# Patient Record
Sex: Female | Born: 1975 | Race: White | Marital: Married | Smoking: Never smoker
Health system: Northeastern US, Academic
[De-identification: ages and names within clinical notes are randomized; demographics above are authoritative.]

---

## 2009-01-13 HISTORY — PX: FINGER SURGERY: SHX640

## 2014-01-05 ENCOUNTER — Encounter (HOSPITAL_COMMUNITY): Payer: Self-pay | Admitting: Emergency Medicine

## 2014-01-05 ENCOUNTER — Emergency Department (HOSPITAL_COMMUNITY)
Admission: EM | Admit: 2014-01-05 | Discharge: 2014-01-05 | Disposition: A | Discharge status: Home / Self Care | Payer: BC Managed Care – PPO | Attending: Emergency Medicine | Admitting: Emergency Medicine

## 2014-01-05 ENCOUNTER — Emergency Department (HOSPITAL_COMMUNITY): Payer: BC Managed Care – PPO

## 2014-01-05 DIAGNOSIS — Z3202 Encounter for pregnancy test, result negative: Secondary | ICD-10-CM | POA: Diagnosis not present

## 2014-01-05 DIAGNOSIS — N83201 Unspecified ovarian cyst, right side: Secondary | ICD-10-CM

## 2014-01-05 DIAGNOSIS — N8320 Unspecified ovarian cysts: Secondary | ICD-10-CM | POA: Insufficient documentation

## 2014-01-05 DIAGNOSIS — R319 Hematuria, unspecified: Secondary | ICD-10-CM

## 2014-01-05 DIAGNOSIS — N83202 Unspecified ovarian cyst, left side: Secondary | ICD-10-CM

## 2014-01-05 DIAGNOSIS — R1031 Right lower quadrant pain: Secondary | ICD-10-CM | POA: Diagnosis present

## 2014-01-05 LAB — URINE MICROSCOPIC-ADD ON

## 2014-01-05 LAB — CBC WITH DIFFERENTIAL/PLATELET
BASOS ABS: 0 10*3/uL (ref 0.0–0.1)
BASOS PCT: 0 % (ref 0–1)
Eosinophils Absolute: 0 10*3/uL (ref 0.0–0.7)
Eosinophils Relative: 0 % (ref 0–5)
HCT: 37.8 % (ref 36.0–46.0)
Hemoglobin: 12.8 g/dL (ref 12.0–15.0)
LYMPHS PCT: 21 % (ref 12–46)
Lymphs Abs: 1.5 10*3/uL (ref 0.7–4.0)
MCH: 31.7 pg (ref 26.0–34.0)
MCHC: 33.9 g/dL (ref 30.0–36.0)
MCV: 93.6 fL (ref 78.0–100.0)
Monocytes Absolute: 0.6 10*3/uL (ref 0.1–1.0)
Monocytes Relative: 8 % (ref 3–12)
NEUTROS ABS: 5.2 10*3/uL (ref 1.7–7.7)
Neutrophils Relative %: 71 % (ref 43–77)
PLATELETS: 177 10*3/uL (ref 150–400)
RBC: 4.04 MIL/uL (ref 3.87–5.11)
RDW: 12.7 % (ref 11.5–15.5)
WBC: 7.3 10*3/uL (ref 4.0–10.5)

## 2014-01-05 LAB — COMPREHENSIVE METABOLIC PANEL
ALT: 12 U/L (ref 0–35)
ANION GAP: 7 (ref 5–15)
AST: 15 U/L (ref 0–37)
Albumin: 3.5 g/dL (ref 3.5–5.2)
Alkaline Phosphatase: 46 U/L (ref 39–117)
BILIRUBIN TOTAL: 0.7 mg/dL (ref 0.3–1.2)
BUN: 6 mg/dL (ref 6–23)
CHLORIDE: 110 meq/L (ref 96–112)
CO2: 25 mmol/L (ref 19–32)
CREATININE: 0.62 mg/dL (ref 0.50–1.10)
Calcium: 8.7 mg/dL (ref 8.4–10.5)
GFR calc Af Amer: >90 mL/min (ref >90)
Glucose, Bld: 107 mg/dL — ABNORMAL HIGH (ref 70–99)
Potassium: 3.7 mmol/L (ref 3.5–5.1)
Sodium: 142 mmol/L (ref 135–145)
Total Protein: 6.5 g/dL (ref 6.0–8.3)

## 2014-01-05 LAB — URINALYSIS, ROUTINE W REFLEX MICROSCOPIC
Bilirubin Urine: NEGATIVE
Glucose, UA: NEGATIVE mg/dL
Ketones, ur: NEGATIVE mg/dL
LEUKOCYTES UA: NEGATIVE
Nitrite: NEGATIVE
PH: 5.5 (ref 5.0–8.0)
Protein, ur: NEGATIVE mg/dL
SPECIFIC GRAVITY, URINE: 1.013 (ref 1.005–1.030)
UROBILINOGEN UA: 0.2 mg/dL (ref 0.0–1.0)

## 2014-01-05 LAB — PREGNANCY, URINE: Preg Test, Ur: NEGATIVE

## 2014-01-05 MED ORDER — IOHEXOL 300 MG/ML  SOLN
80.0000 mL | Freq: Once | INTRAMUSCULAR | Status: AC | PRN
  Administered 2014-01-05: 80 mL via INTRAVENOUS

## 2014-01-05 MED ORDER — HYDROCODONE-ACETAMINOPHEN 5-325 MG PO TABS: 1.0000 | ORAL_TABLET | ORAL | Status: AC | PRN

## 2014-01-05 MED ORDER — IBUPROFEN 800 MG PO TABS: 800.0000 mg | ORAL_TABLET | Freq: Three times a day (TID) | ORAL | Status: AC

## 2014-01-05 NOTE — ED Notes (Signed)
Pts family member given cup of coffee.

## 2014-01-05 NOTE — ED Provider Notes (Signed)
CSN: [id number]     Arrival date & time 01/05/14  0822 History   First MD Initiated Contact with Patient 01/05/14 0831     Chief Complaint  Patient presents with  . Abdominal Pain     (Consider location/radiation/quality/duration/timing/severity/associated sxs/prior Treatment) HPI Comments: Pt comes in today with complaint of abdominal pain that started acutely yesterday morning. She has taken some ibuprofen with mild relief. Pt states that the pain is constant. No fever, vomiting. Has had some diarrhea. States that she has a miscarriage in august and has some general pelvic pain which tends to be more on the left side. Pt states that this was diffuse both more localized to the right side.known fibroid. lmp last week:denies vaginal discharge  The history is provided by the patient. No language interpreter was used.    History reviewed. No pertinent past medical history. History reviewed. No pertinent past surgical history. No family history on file. History  Substance Use Topics  . Smoking status: Never Smoker   . Smokeless tobacco: Not on file  . Alcohol Use: Yes   OB History    Gravida Para Term Preterm AB TAB SAB Ectopic Multiple Living   1    1          Review of Systems  All other systems reviewed and are negative.     Allergies  Sulfa antibiotics  Home Medications   Prior to Admission medications   Medication Sig Start Date End Date Taking? Authorizing Provider  ibuprofen (ADVIL,MOTRIN) 200 MG tablet Take 800 mg by mouth every 6 (six) hours as needed (pain).   Yes Historical Provider, MD   BP 125/76 mmHg  Pulse 85  Temp(Src) 98 F (36.7 C) (Oral)  Resp 16  SpO2 100%  LMP 01/04/2014 Physical Exam  Constitutional: She is oriented to person, place, and time. She appears well-developed and well-nourished.  Cardiovascular: Normal rate and regular rhythm.   Pulmonary/Chest: Effort normal and breath sounds normal.  Abdominal: Soft. Bowel sounds are normal. There  is tenderness in the right lower quadrant.  Musculoskeletal: Normal range of motion.  Neurological: She is alert and oriented to person, place, and time.  Skin: Skin is warm and dry.  Psychiatric: She has a normal mood and affect.  Nursing note and vitals reviewed.   ED Course  Procedures (including critical care time) Labs Review Labs Reviewed  COMPREHENSIVE METABOLIC PANEL - Abnormal; Notable for the following:    Glucose, Bld 107 (*)    All other components within normal limits  URINALYSIS, ROUTINE W REFLEX MICROSCOPIC - Abnormal; Notable for the following:    APPearance CLOUDY (*)    Hgb urine dipstick MODERATE (*)    All other components within normal limits  URINE MICROSCOPIC-ADD ON - Abnormal; Notable for the following:    Squamous Epithelial / LPF FEW (*)    All other components within normal limits  CBC WITH DIFFERENTIAL  PREGNANCY, URINE    Imaging Review Ct Abdomen Pelvis W Contrast  01/05/2014   CLINICAL DATA:  Abdominal pain, possible appendicitis,, right lower quadrant pain for few days, diffuse abdominal pain last night  EXAM: CT ABDOMEN AND PELVIS WITH CONTRAST  TECHNIQUE: Multidetector CT imaging of the abdomen and pelvis was performed using the standard protocol following bolus administration of intravenous contrast.  CONTRAST:  [place]40Equ4[place]mti[place]407Equ3[place]ti[place]402LuzEqu3[place]mti[place]406Luz(9Equ6[place]mti[place]408Luz3CEquities tradery IOHEXOL 300 MG/ML  SOLN  COMPARISON:  None.  FINDINGS: Sagittal images of the spine shows mild disc space flattening with vacuum disc phenomenon at L5-S1 level.  Minimal posterior spurring at L5-S1 level.  Lung bases are unremarkable.  Enhanced liver shows no focal mass. No intrahepatic biliary ductal dilatation. No calcified gallstones are noted within gallbladder. Enhanced pancreas, spleen and adrenal glands are unremarkable. Kidneys are symmetrical in size and enhancement. No focal renal mass. No hydronephrosis or hydroureter. Abdominal aorta is unremarkable.  No small bowel obstruction. No thickened or dilated small bowel loops  are noted.  No destructive bony lesions are noted within pelvis. Bilateral hip joints are unremarkable. No inguinal adenopathy.  There is no pericecal inflammation. Normal appendix clearly visualized in axial image 56.  Normal appendix confirmed in coronal image 29.  The uterus measures 8 by 5.3 cm. Question mild septate/ arcuate uterus fundus. There is a myometrial fibroid in left uterine body measures 4.8 x 3.4 cm. Best seen in sagittal image 63. Mild mass effect on the left endometrial cavity. A simple cyst/follicle within right ovary measures 2.6 cm. There is probable minimal hemorrhagic cyst within left ovary anteriorly measures 3.1 cm. Second left ovarian cyst measures 3.2 cm. Follow-up pelvic ultrasound in 6-8 weeks is suggested as clinically warranted. No pelvic free fluid. The urinary bladder is unremarkable.  No inguinal adenopathy.  IMPRESSION: 1. No acute inflammatory process within abdomen. 2. Normal appendix.  No pericecal inflammation. 3. There is a myometrial fibroid within left uterine body measures 4.8 x 3.4 cm. A right ovarian cyst/follicle measures 2.6 cm. Minimal hemorrhagic cyst within left ovary anteriorly measures 3.1 cm. Second simple cyst within left ovary measures 3.2 cm. Follow-up pelvic ultrasound in 6-8 weeks is suggested as clinically warranted. Question arcuate uterus. 4. No hydronephrosis or hydroureter. 5. No small bowel obstruction.   Electronically Signed   By: Natasha MeadLiviu  Pop M.D.   On: 01/05/2014 11:38     EKG Interpretation None      MDM   Final diagnoses:  Cysts of both ovaries  Hematuria    Discussed findings with pt and she will follow up with gyn when she gets home. Will send home with ibuprofen and hydrocodone for pain.    Teressa LowerVrinda Daud Cayer, NP 01/05/14 1155  Purvis SheffieldForrest Harrison, MD 01/05/14 2038

## 2014-01-05 NOTE — ED Notes (Signed)
Patient states she had a miscarriage in August and has been having some abdominal pain since.   Patient states has worsened in the last few days and mostly in RLQ.   Patient states diffuse pain with 8/10 pain last night.  Patient states she took ibuprofen for pain.   Patient denies urinary symptoms.   Patient states has had some nausea, but denies vomiting.

## 2014-01-05 NOTE — ED Notes (Signed)
Patient transported to CT 

## 2014-01-05 NOTE — Discharge Instructions (Signed)
Ovarian Cyst An ovarian cyst is a fluid-filled sac that forms on an ovary. The ovaries are small organs that produce eggs in women. Various types of cysts can form on the ovaries. Most are not cancerous. Many do not cause problems, and they often go away on their own. Some may cause symptoms and require treatment. Common types of ovarian cysts include:  Functional cysts--These cysts may occur every month during the menstrual cycle. This is normal. The cysts usually go away with the next menstrual cycle if the woman does not get pregnant. Usually, there are no symptoms with a functional cyst.  Endometrioma cysts--These cysts form from the tissue that lines the uterus. They are also called "chocolate cysts" because they become filled with blood that turns brown. This type of cyst can cause pain in the lower abdomen during intercourse and with your menstrual period.  Cystadenoma cysts--This type develops from the cells on the outside of the ovary. These cysts can get very big and cause lower abdomen pain and pain with intercourse. This type of cyst can twist on itself, cut off its blood supply, and cause severe pain. It can also easily rupture and cause a lot of pain.  Dermoid cysts--This type of cyst is sometimes found in both ovaries. These cysts may contain different kinds of body tissue, such as skin, teeth, hair, or cartilage. They usually do not cause symptoms unless they get very big.  Theca lutein cysts--These cysts occur when too much of a certain hormone (human chorionic gonadotropin) is produced and overstimulates the ovaries to produce an egg. This is most common after procedures used to assist with the conception of a baby (in vitro fertilization). CAUSES   Fertility drugs can cause a condition in which multiple large cysts are formed on the ovaries. This is called ovarian hyperstimulation syndrome.  A condition called polycystic ovary syndrome can cause hormonal imbalances that can lead to  nonfunctional ovarian cysts. SIGNS AND SYMPTOMS  Many ovarian cysts do not cause symptoms. If symptoms are present, they may include:  Pelvic pain or pressure.  Pain in the lower abdomen.  Pain during sexual intercourse.  Increasing girth (swelling) of the abdomen.  Abnormal menstrual periods.  Increasing pain with menstrual periods.  Stopping having menstrual periods without being pregnant. DIAGNOSIS  These cysts are commonly found during a routine or annual pelvic exam. Tests may be ordered to find out more about the cyst. These tests may include:  Ultrasound.  X-ray of the pelvis.  CT scan.  MRI.  Blood tests. TREATMENT  Many ovarian cysts go away on their own without treatment. Your health care provider may want to check your cyst regularly for 2-3 months to see if it changes. For women in menopause, it is particularly important to monitor a cyst closely because of the higher rate of ovarian cancer in menopausal women. When treatment is needed, it may include any of the following:  A procedure to drain the cyst (aspiration). This may be done using a long needle and ultrasound. It can also be done through a laparoscopic procedure. This involves using a thin, lighted tube with a tiny camera on the end (laparoscope) inserted through a small incision.  Surgery to remove the whole cyst. This may be done using laparoscopic surgery or an open surgery involving a larger incision in the lower abdomen.  Hormone treatment or birth control pills. These methods are sometimes used to help dissolve a cyst. HOME CARE INSTRUCTIONS   Only take over-the-counter   or prescription medicines as directed by your health care provider.  Follow up with your health care provider as directed.  Get regular pelvic exams and Pap tests. SEEK MEDICAL CARE IF:   Your periods are late, irregular, or painful, or they stop.  Your pelvic pain or abdominal pain does not go away.  Your abdomen becomes  larger or swollen.  You have pressure on your bladder or trouble emptying your bladder completely.  You have pain during sexual intercourse.  You have feelings of fullness, pressure, or discomfort in your stomach.  You lose weight for no apparent reason.  You feel generally ill.  You become constipated.  You lose your appetite.  You develop acne.  You have an increase in body and facial hair.  You are gaining weight, without changing your exercise and eating habits.  You think you are pregnant. SEEK IMMEDIATE MEDICAL CARE IF:   You have increasing abdominal pain.  You feel sick to your stomach (nauseous), and you throw up (vomit).  You develop a fever that comes on suddenly.  You have abdominal pain during a bowel movement.  Your menstrual periods become heavier than usual. MAKE SURE YOU:  Understand these instructions.  Will watch your condition.  Will get help right away if you are not doing well or get worse. Document Released: 12/30/2004 Document Revised: 01/04/2013 Document Reviewed: 09/06/2012 ExitCare Patient Information 2015 [employer]. This information is not intended to replace advice given to you by your health care provider. Make sure you discuss any questions you have with your health care provider.  

## 2014-06-06 ENCOUNTER — Encounter: Payer: Self-pay | Admitting: Gastroenterology

## 2014-06-07 ENCOUNTER — Encounter: Payer: Self-pay | Admitting: Gastroenterology

## 2014-06-08 ENCOUNTER — Encounter: Payer: Self-pay | Admitting: Gastroenterology

## 2014-06-08 ENCOUNTER — Encounter: Payer: Self-pay | Admitting: Reproductive Endocrinology and Infertility

## 2014-06-08 ENCOUNTER — Ambulatory Visit: Payer: Self-pay | Admitting: Reproductive Endocrinology and Infertility

## 2014-06-08 VITALS — BP 129/95 | HR 81 | Ht 68.0 in | Wt 155.0 lb

## 2014-06-08 DIAGNOSIS — R102 Pelvic and perineal pain: Secondary | ICD-10-CM

## 2014-06-08 DIAGNOSIS — Z3141 Encounter for fertility testing: Secondary | ICD-10-CM

## 2014-06-08 DIAGNOSIS — N809 Endometriosis, unspecified: Secondary | ICD-10-CM

## 2014-06-08 DIAGNOSIS — D219 Benign neoplasm of connective and other soft tissue, unspecified: Secondary | ICD-10-CM

## 2014-06-08 LAB — CBC
Hematocrit: 42 % (ref 34–45)
Hemoglobin: 14 g/dL (ref 11.2–15.7)
MCH: 32 pg/cell (ref 26–32)
MCHC: 33 g/dL (ref 32–36)
MCV: 97 fL — ABNORMAL HIGH (ref 79–95)
Platelets: 250 10*3/uL (ref 160–370)
RBC: 4.3 MIL/uL (ref 3.9–5.2)
RDW: 12.7 % (ref 11.7–14.4)
WBC: 6.9 10*3/uL (ref 4.0–10.0)

## 2014-06-08 LAB — TSH: TSH: 1.86 u[IU]/mL (ref 0.27–4.20)

## 2014-06-08 NOTE — Progress Notes (Signed)
Met with couple after their new pt consult with Dr Thurston Pounds.  Labs drawn today included: AMH, Rubella, TSH,a dn CBC.  She states that her blood type is O negative and that she has required RHOGAM in the past.  She will contact the office about what her preference is for treatment after contemplating the options.  She will also check with her OB/Gyn office about prior CF testing. She was given the Counsyl packet to review.

## 2014-06-08 NOTE — Patient Instructions (Signed)
Can access today's lab tests via my chart once registered.  Call with your decision about whether to pursue surgery or Clomid treatment.  Inquire about CF testing with previous provider.      HYSTEROSALPINGOGRAM  What is a hysterosalpingogram (HSG)?    A hysterosalpingogram or HSG is an x-ray procedure performed to determine whether the fallopian tubes are open and to see if the shape of the uterine cavity is normal.  An HSG is a procedure that is typically done in our office and takes less than one half hour to perform.  It is usually done after menses have ended, but before ovulation occurs (generally between cycle days 6-12).  How is the HSG done?  You will be positioned under a fluoroscope (a real-time x-ray imager) on a table.  A speculum exam will be performed to visualize the cervix.  A small catheter is used to introduce contrast media (dye) through the cervical canal.  The contrast fills the uterus, then enters the tubes, outlines the length of the tubes, and spills out their ends if they are open.  Any abnormalities in the uterine cavity or fallopian tubes will be visible on a monitor.  Following the completion of the test, you can return to your normal routine.  You may experience spotting and/or mild cramping for 1-2 days.  You should avoid intercourse or douching for the next two days.  If you develop any fever, chills, severe abdominal pain or heavy vaginal bleeding, you should contact the physician immediately.  While you are welcome to have someone drive you to your appointment, it is not a requirement.  Any accompanying individuals will not be allowed in the procedure room during your HSG.  What are the risks and complications of an HSG?     Discomfort.  An HSG can cause mild or moderate uterine cramping for about 5 minutes, however some women may experience cramps for several hours.  We recommend taking ibuprofen (Motrin, Advil) one hour prior to the test to help minimize  discomfort.   Infection.  Infection may happen, although this is rare, occurring less than 2% of the time.  Signs of an infection are lower abdominal pain and fever that develop within a few days following the procedure. You should contact the physician if you develop these signs.  If an infection develops, hospitalization with IV antibiotics or surgery may be necessary.  A consequence of this infection may be scarred fallopian tubes and infertility.  Infections are more likely to occur in women who have already had a previous pelvic infection and/or previously damaged tubes.  Antibiotics may be prescribed after the HSG if tubal abnormalities are discovered.   Allergic reaction.  Rarely, a patient may have an allergy to the iodine contrast used in an HSG.  You should alert the physician, prior to your HSG, if you have a history of a previous allergic reaction to iodine, intravenous contrast dyes or seafood.  If this reaction was severe, we will probably not recommend an HSG.  A saline sonohysterogram may be performed instead of the HSG, although this test will provide information only about your uterine cavity, it does not provide information about your fallopian tubes.  If you experience a rash, itching, or swelling after the procedure  contact our office.   Exposure of potential pregnancy.  Despite your perception of a normal menstrual period, there is always the possibility of a potential pregnancy.  For this reason, a blood pregnancy test is required for  all patients prior to the HSG.  Instructions prior to the HSG.   Please call our office at [phone] on day 1-3 of your menstrual cycle to schedule your HSG between cycle days 6-12.  The HSG is performed in our office, Monday through Friday.   A  blood pregnancy test is required and can be done from the onset of your menses to the day before your HSG.  If you are doing an HSG and a treatment cycle in the same month, it is most convenient to have this  done at the time of your baseline ultrasound.  If you are not doing a treatment cycle, then you will need to go to a lab to have this bloodwork done.  When you schedule your HSG, a lab slip will be faxed to a lab of your choice to have your pregnancy test drawn prior to your procedure.  We will only call you if the pregnancy test is positive.   You may take Ibuprofen 600mg  approximately one hour prior to your HSG.    Hysterosalpingogram.docx Rev 2/12[facility]  [address]  [place], [place] [address]    SEMEN ANALYSIS  Some frequently asked questions     A semen analysis is an important initial step in any fertility evaluation. It determines the general quality of the female partners semen. The following are some commonly asked questions about the test that may be helpful to you prior to your semen analysis.     What does the semen analysis measure?    A comprehensive semen analysis measures:  1. Ejaculate volume: measured in milliliters.  This will help in the diagnosis of such disorders as retrograde ejaculation and ejaculatory duct obstruction.   2. Viscosity-thickness of sample  3.  Sperm count: the total number of sperm in the whole ejaculate.  Total sperm count is calculated by multiplying the number of sperm per ml. (concentration) by the ejaculate volume.  4.  Motility: the percentage of sperm that are moving.  In any ejaculate, some sperm will be moving and some will not.  Sperm not moving will not participate in fertilization.  It is normal to have up to 50% of the specimen non-motile.  5.  Morphology: the appearance (size and shape) of the sperm.  Many sperm will have defects in the head, mid-piece, or tail.  If these defects are seen in high numbers (teratozoospermia), it can impair the swimming strength or ability of the sperm to penetrate and fertilize the egg.    6.  Number of white blood cells present in the sperm.  (*Any unusual contaminants such as bacteria or red blood  cells would also be noted.)       How do I go about having a semen analysis?     You must first have a requisition form signed by the referring physician. The requisition can be provided by your primary care physician, urologist or your partners gynecologist. All analyses are done by appointment only. Please call the lab directly to make an appointment at [phone] option 3. Lab hours are Monday - Thursday 8:00am - 3:30pm.  When you call to schedule your appointment, the secretary will review instructions, and discuss insurance.     Does Insurance pay for a semen analysis?     Many policies do pay for semen analysis; it depends on your insurance provider.  Medicaid, Medicare, Guardian, Via Health, Blue Choice Option and Preferred Care Option do not cover  this test. The test is $100.00 due at time of appointment if your insurance does not cover it. A receipt will be given at time of payment.        How should the semen be collected?     The semen should be collected into a sterile plastic container (normal urine container), which can be provided by the lab or your doctors office.  The specimen can be collected by masturbation, oral stimulation or intercourse. Do not use lubricants while collecting the sample.       Where should the semen be collected?    The semen specimen can be collected at home, as long as it can be delivered to the lab within 1 hour after ejaculation. Your sample should be kept close to body temperature during transport.  If preferred, you may reserve a collection room, when you make your appointment, and collect your sample at our facility. The collection room contains magazines and movies to aid collection.      Is there a time of abstinence that is best for the test results?       It is recommended that a man have an ejaculation to flush his system, and then abstain for 2-4 days prior to collection of his specimen on his appointment date.  (It is not better to abstain for 1 week,  with the intent to increase the sperm count. Prolonged abstinence will only increase dead sperm, debris and cells.)     Do the white blood cells in the semen mean there is an infection?     Not always, some white blood cells in semen are normal. Excessive white cells, consistent over repeated analyses may signal an infection. Your physician will discuss this with you and determine if treatment is appropriate.     What happens to the report of the semen analysis?     The semen analysis report is sent directly to the referring physician. The report will not be sent to the patient. All results and questions about results should be discussed directly with your doctor. They know your medical history and can discuss further testing and possible treatment regimens.       Please call the [facility] at [phone] to schedule your semen analysis.

## 2014-06-08 NOTE — H&P (Signed)
REI INFERTILITY VISIT     HPI   I had the pleasure of seeing your patient Lori Atkinson for evaluation and management of her pelvic pain.  Jonie stopped taking the pill about 1 year ago in order to get pregnant.  She had been on the pill pretty much continuously since the age of 39.  She never had any pain and her withdrawal bleeds were predictable.      Soon after she stopped the pills, she got pregnant.  Unfortunately, she suffered a missed abortion at about 8 weeks and was managed with misoprostol successfully.  She then started having pelvic pains and ultrasound examination revealed bilateral ovarian cysts consistent with endometriomas.  There are two cysts on the left ovary which also happens to be larger at 7x4 cm.  The right ovary has a 4x3 cm cyst.  Her pain seems to be always on the left, going up to her diaphragm.  The pain occurs intermittently (no relationship to her cycles) and can last 2-3 days.  This is disruptive to her work as well.  She has now gone back on the pills which is frustrating as she would very much like to get pregnant.      I have reviewed the sonogram images from 04/2014 and confirmed the above findings.  In addition, she has a 5 cm intramural/subserosal fibroid in the right posterior wall of the uterus.    We discussed her options.  She could have surgery with risk of DOR or menopause post surgery.  The advantage is that studies have shown improved fertility after surgery.  She suggested the possibility of operating only on the left ovary as that is where she gets pain mostly.  Alternatively, She could live with the pain and try clomid with IUI to speed up that chance of pregnancy.  Another possibility is to take lupron and then do IVF while on lupron.    She will think through her options and decide the on best option>     Personal Profile  Ethnicity: Caucasian  Marital status:  Married  Length of relationship:1.5 yrs    MENSTRUAL HISTORY   LMP:  Patient's last menstrual  period was 05/21/2014.  Menarche prior to age 39:  yes  Menstrual interval: regular   Recent changes in periods: no  Heavy menses: no   Clots: no   Intermenstrual bleeding: no   Postcoital bleeding: no   Dysmenorrhea: no     ENDOCRINE HISTORY   Weight change: no   Hirsutism: no   Acne: no   Galactorrhea: no   Hot flashes: no   History of DES exposure: no     GYNECOLOGIC HISTORY   Prior STIs: none  History of PID: no  History of abnormal pap test: no  Pelvic pain: no  Endometriosis: no  Fibroids: no  Painful Intercourse: no    OBSTETRIC HISTORY  G1P0  MIsoprostol at 8 weeks for missed AB  Got pregnant very quickly upon stopping the pill      PAST MEDICAL HISTORY  None    PAST SURGICAL HISTORY  No past surgical history on file.    MEDICATIONS  Prior to Admission medications    Medication Sig Start Date End Date Taking? Authorizing Provider   cetirizine (ZYRTEC) 5 MG tablet Take 5 mg by mouth daily   Yes [provider]    OCP (orthocyclen)  Pain meds - Ibuprofen 800/T3/Vicodin      ALLERGIES  Allergies   Allergen Reactions  Sulfa Antibiotics Hives        FAMILY HISTORY  None contributory    SOCIAL HISTORY  Occupation: Radiologist   She reports that she has never smoked. She does not have any smokeless tobacco history on file.     PRECONCEPTION LABS  Varicella status: immune by history  Blood type: unknown    PARTNER'S HISTORY   DOB: [place] [birth year]  Occupation/job: Designer - [employer]  Ethnicity: Caucasian     Previously fathered pregnancies: No  Urological evaluation: No  History of undescended testicles: no  Puberty at a normal age as a teenager: yes  History of chlamydia or gonorrhea: no  Significant radiation exposure: no  Significant  pesticide or toxic solvent exposure: no  Body building medication or supplements: no  Marijuana: no  Chronic illness:  no    Medications: Zyrtec    Allergies: none    Surgeries: Ts&A    Family history: no history of infertility or birth defects    PHYSICAL  EXAM  Blood pressure 129/95, pulse 81, height 1.727 m (5\' 8" ), weight 70.308 kg (155 lb), last menstrual period 05/21/2014. Body mass index is 23.57 kg/(m^2).    Physical examination not performed, consultation only    ASSESSMENT  Infertility    PLAN  Consider her options and get back to me with decision  Surgery vs Clomid/IUI vs Lupron/IVF      The duration of today's visit was 65 minutes, the majority of which was dedicated to counseling and coordination of care.     Trina Ao, MD

## 2014-06-09 LAB — RUBELLA ANTIBODY, IGG: Rubella IgG AB: POSITIVE

## 2014-06-11 LAB — ANTIMULLERIAN HORMONE (AMH): Anti Mullerian Hormone: 4.668 ng/mL (ref 0.176–11.705)

## 2014-06-12 ENCOUNTER — Encounter: Payer: Self-pay | Admitting: Reproductive Endocrinology and Infertility

## 2014-06-18 ENCOUNTER — Encounter: Payer: Self-pay | Admitting: Reproductive Endocrinology and Infertility

## 2014-06-19 ENCOUNTER — Encounter: Payer: Self-pay | Admitting: Reproductive Endocrinology and Infertility

## 2014-06-20 ENCOUNTER — Telehealth: Payer: Self-pay

## 2014-06-20 NOTE — Telephone Encounter (Signed)
Call to patient requesting a copy of frint and back of her pharmacy benefit card so Ovidrel authorization can be initiated. If pt has any questions can call and ask to speak w Probation officer tomorrow

## 2014-06-27 ENCOUNTER — Telehealth: Payer: Self-pay

## 2014-06-27 ENCOUNTER — Other Ambulatory Visit: Payer: Self-pay

## 2014-06-27 MED ORDER — CHORIOGONADOTROPIN ALFA (OVIDREL) 250 MCG/0.5ML SC SOSY *I*
250.0000 ug | PREFILLED_SYRINGE | Freq: Once | SUBCUTANEOUS | 3 refills | Status: AC
Start: 2014-06-27 — End: 2014-06-27

## 2014-06-27 NOTE — Telephone Encounter (Signed)
Writer called pt requesting call back regarding if she had separate Pharmacy card 06/19/13 and have not heard back. Will attempt to authorize under her [place]. RX Ovidrel will be sent to [facility] w 3 refills.

## 2014-06-29 ENCOUNTER — Telehealth: Payer: Self-pay

## 2014-06-29 NOTE — Telephone Encounter (Signed)
Pt called very upset regarding prior authorization and order of her Ovidrel. Writer completed task per Netty Starring RN req. Per [employer], pt was going to use in future cycles and RN was simply trying to get everything in place for pt. Advised pt to notify pharmacy to place on hold and that she is not obligated to have dispensed. Apologized for any confusion. Did advise pt to bring in her RX benefit card at next f/u appt.

## 2014-07-05 NOTE — Telephone Encounter (Signed)
I called [employer] and spoke with Amy M.  I was inquiring on the prior authorization for Ovidrel that we had sent in last week.  She informed me that the patient only has medical coverage under the [employer].  No pharmacy benefits.  I LVM for the patient letting her know and for her to call me back.

## 2014-07-31 ENCOUNTER — Ambulatory Visit: Payer: Self-pay | Admitting: Reproductive Endocrinology and Infertility

## 2014-07-31 ENCOUNTER — Encounter: Payer: Self-pay | Admitting: Reproductive Endocrinology and Infertility

## 2014-07-31 VITALS — BP 135/89 | HR 80 | Resp 16 | Ht 68.0 in | Wt 159.0 lb

## 2014-07-31 DIAGNOSIS — N809 Endometriosis, unspecified: Secondary | ICD-10-CM

## 2014-07-31 DIAGNOSIS — Z3169 Encounter for other general counseling and advice on procreation: Secondary | ICD-10-CM

## 2014-07-31 NOTE — Progress Notes (Signed)
REI FOLLOW-UP VISIT    Cesia is a 39 y.o. G1P0010 who presents for follow up.    Results for KIEU, QUIGGLE (MRN [id number]) as of 07/31/2014 10:33   06/08/2014 10:11   TSH 1.86   Anti Mullerian Hormone 4.668   WBC 6.9   RBC 4.3   Hemoglobin 14.0   Hematocrit 42   MCV 97 (H)   MCH 32   MCHC 33   RDW 12.7   Platelets 250   Rubella IgG AB POSITIVE     AMH is reassuring.    HSG - had it done at work and it was normal    Her pain control is better and she would like to try natural to start with and also would like to get some more information about IVF.  We discussed off site monitoring and the difficulty when there is associated endometriomas.       We reviewed ovulation predictor kit use and TIC on the day of and the day after [facility] surge.  All the couple's questions were answered.      Rayme's medications, allergies, and past medical, surgical, family and social history reviewed and updated.      REVIEW OF SYSTEMS  Negative except for HPI    PHYSICAL EXAM  Blood pressure 135/89, pulse 80, resp. rate 16, height 1.727 m (_0 ), weight 72.1 kg (159 lb), last menstrual period 07/12/2014. Body mass index is 24.18 kg/(m^2).      ASSESSMENT  40 yo with bilateral endometriomas    PLAN  Natural cycle TIC for 2-3 months  Then, CC100/monitoring at [place] and IUI for a few cycles   Then, consider IVF      The duration of today's visit was 25 minutes, the majority of which was dedicated to counseling and coordination of care.     Trina Ao, MD

## 2014-07-31 NOTE — Progress Notes (Signed)
Met with couple after their follow up appt with Dr Thurston Pounds.  Pt states that she recently stopped OC's last month.  In the past she had cycles ranging 29-31 days. Reviewed plan for trying on their own for 2-3 cycles with natural cycle/opk/timed intercourse.  If she detects a positive home pregnancy test shew ill call to arrange bhcg at [facility].  They were given information to review about clomid monitoring and IUI, should they wish to proceed with that.  They were told to call the office at least one week before the anticipated menses they would want to start CC/IUI with.  That will allow the office to check on the Ovidrel preauth and to review the CC monitoring plan.  Dr Thurston Pounds will complete the monitoring plan at that time.  She was given some OOT reqs and told that she will need to provide the exact location and fax # for the [facility] site that is closest for her.

## 2014-07-31 NOTE — Patient Instructions (Signed)
Call if detect a positive urine pregnancy test so bhcg can be arranged locally.                If not pregnant after 2-3 months, call the office about one week before cycle you wish to start Clomid treatment so that treatment plan can be finalized and [facility] on [place].         Clomiphene Citrate      Generic Name                                                              Brand Name  clomiphene citrate                                                             Clomid,  Serophene     How it works:  Clomiphene citrate comes in a pill, which a woman will begin taking usually on the fifth day of her menstrual cycle through the ninth day. The typical starting dose is one 50mg  tablet per day for five days. If a cycle is unsuccessful, your physician may increase the dose by 50mg  increments in subsequent cycles.    Clomiphene stimulates the release of hormones needed to cause ovulation.     Clomiphene citrate sends a signal that the level of estrogen is low (even if it is not in actuality, low). As a result, the hypothalamus in the brain sends a signal to the pituitary gland to release more FSH and luteinizing hormone (LH) into the bloodstream. The high level of FSH, in turn, stimulates the development of a follicle and egg. A surge of LH may occur about a week after the last tablet is taken. This surge of LH causes the egg to be released in a process called ovulation. Many patients take ovidrel to trigger ovulation instead of waiting for an [facility] surge.  In some cases, the treatment may result in more than one egg. If ovulation does occur, fertilizing the released egg, either through timed intercourse or intrauterine insemination, is the next step.     Monitoring:  Call with the first day of flow to schedule a baseline ultrasound usually between the 1st through the 5th day of your period. Some patients need to come in by cycle day 3 for labs. If there is a cyst at the baseline u/s your cycle may be deferred until  next month. These are usually functional cysts and not a cause for alarm.     Your scripts will be sent to the pharmacy at your baseline u/s.    Midcycle ultrasounds are done around cycle day 12-14 to determine your response to the medication and how many follicles are developing and if they are mature.  A mature follicle measures about 18-20 mm in size, although occasionally smaller follicles will also ovulate. We will also check blood levels of estradiol and LH to give further information about the cycle.    Once a mature follicle is seen, you may be instructed to take ovidrel and schedule an IUI or have intercourse. Most patients are instructed  to have intercourse the evening of ovidrel also, but this may vary by patient situation.    Side effects:  *Multiple gestation- Clomid is associated with a slightly increased risk (8-10%) of        multiple gestation pregnancy (more than one baby during pregnancy, i.e., twins,   and rarely triplets or more).   *Ovarian Enlargement: About 15% of patients will notice ovarian cyst formation, possibly    accompanied by abdominal discomfort and/or bloating. These cysts usually   regress without treatment. There may be a mild mid cycle abdominal pain at the   time of ovulation, which is normal.   *Hot Flashes: Approximately 10% of patients will have vasomotor symptoms known as    hot flashes, a temporary feeling of facial flushing, or tingling/numbness in    extremities.    These are self- limiting.   *Decreased Cervical Mucus: Approximately 20-25% of patients will experience a    decrease in the amount of cervical mucus made, or a thin endometrial lining.   *Mood Changes: some patients feel irritable ( 1%)    Uncommon Clomid Side Effects:   *  Nausea or Vomiting (2%)   *  Breast Tenderness (2%)   *  Visual Symptoms Approximately 1% of patients will experience blurred or      spotted vision. If this occurs, discontinue Clomiphene use immediately and      contact the office.   *   Headaches (1%)     Ovarian Cancer Risk: Some studies have suggested that the use of fertility medications may increase the risk of ovarian cancer. This finding has been refuted in other studies, therefore, the true risk, if any, is unknown at this time.                                                     Clomid info sheet.doc rev 2/12

## 2014-08-16 ENCOUNTER — Other Ambulatory Visit: Payer: Self-pay | Admitting: Reproductive Endocrinology and Infertility

## 2014-08-28 ENCOUNTER — Encounter: Payer: Self-pay | Admitting: Reproductive Endocrinology and Infertility

## 2014-11-13 ENCOUNTER — Encounter: Payer: Self-pay | Admitting: Reproductive Endocrinology and Infertility

## 2014-11-17 ENCOUNTER — Telehealth: Payer: Self-pay

## 2014-11-17 ENCOUNTER — Other Ambulatory Visit: Payer: Self-pay

## 2014-11-17 NOTE — Telephone Encounter (Signed)
Pt called to report LMP of 11/17/14.  She will return from [place] on Sunday 11/19/14 and will first be available for monitoring in [facility] on 11/20/14.  She had preferred to do Letrozole, but given the timing of her cycle and her travel, has opted to do Clomid this cycle instead so that she doesn't have to defer treatment start for another month.  Will initiate a treatment plan for CC 100 mg per Dr Gilles Chiquito initial note.   She will call on 11/20/14 to notify office of when she scheduled baseline studies, will notify office if she needs any OOT reqs,  She knows that baseline studies need to be completed by the morning of  11/21/14. Pharmacy of preference for clomid will be [facility] on [address] in [place].  Will initiate Ovidrel preauth via [facility].

## 2014-11-17 NOTE — Telephone Encounter (Signed)
Pre-auth to [facility] and RX Ovidrel to [facility].

## 2014-11-18 MED ORDER — CHORIOGONADOTROPIN ALFA (OVIDREL) 250 MCG/0.5ML SC SOSY *I*
250.0000 ug | PREFILLED_SYRINGE | Freq: Once | SUBCUTANEOUS | 3 refills | Status: AC
Start: 2014-11-18 — End: 2014-11-18

## 2014-11-20 ENCOUNTER — Telehealth: Payer: Self-pay

## 2014-11-20 ENCOUNTER — Encounter: Payer: Self-pay | Admitting: Reproductive Endocrinology and Infertility

## 2014-11-20 ENCOUNTER — Other Ambulatory Visit: Payer: Self-pay | Admitting: Gastroenterology

## 2014-11-20 NOTE — Telephone Encounter (Signed)
Per fax from [place], this medication is on the patient's plan of covered medications.

## 2014-11-20 NOTE — Telephone Encounter (Signed)
Pt called to relate that she had ultrasound done today at [facility], labs were also drawn but were being sent out with courier this afternoon and results expected tomorrow, 11/21/14.  Although ultrasound showed fibroid in uterus and cysts in left ovary, pt states that this is her norm.  She states that she has had the cysts for close to 1 yr. She began spotting 11/17/14, but actual flow was 11/18/14.   Will need to send Clomid Rx to local pharmacy.  She states that she was contacted by [facility] about Ovidrel but the preauth via [facility] is still pending.   Will call her 11/21/14 once labs are available.

## 2014-11-22 ENCOUNTER — Encounter: Payer: Self-pay | Admitting: Reproductive Endocrinology and Infertility

## 2014-11-22 DIAGNOSIS — N979 Female infertility, unspecified: Secondary | ICD-10-CM

## 2014-11-22 MED ORDER — CLOMIPHENE CITRATE 50 MG PO TABS *I*
100.0000 mg | ORAL_TABLET | Freq: Every day | ORAL | Status: DC
Start: 2014-11-22 — End: 2015-06-02

## 2014-11-22 NOTE — Telephone Encounter (Signed)
TC to pt.  OK to proceed with Clomid 100 11/9 x 5 days.  Rx ordered to [facility] on [address] in [place], [place].  Reviewed w/pt that her U/Ss will be difficult to interpret @ [place].  Would be better somewhere where we are more familiar with.  To schedule mid-cycle follicle study and E2, LH on 11/16, early in the day.  Pt expresses understanding.

## 2014-11-27 ENCOUNTER — Encounter: Payer: Self-pay | Admitting: Reproductive Endocrinology and Infertility

## 2014-11-28 ENCOUNTER — Encounter: Payer: Self-pay | Admitting: Reproductive Endocrinology and Infertility

## 2014-11-28 ENCOUNTER — Encounter: Payer: Self-pay | Admitting: Gastroenterology

## 2014-11-29 ENCOUNTER — Telehealth: Payer: Self-pay | Admitting: Reproductive Endocrinology and Infertility

## 2014-11-29 ENCOUNTER — Encounter: Payer: Self-pay | Admitting: Reproductive Endocrinology and Infertility

## 2014-11-29 NOTE — Telephone Encounter (Signed)
TC to pt, we received a corrected ultrasound report today from [place] and there is a follicle that is [id number] (yesterdays report was much smaller)  Her E2/LH were elevated from yesterday as well so a progesterone was added (prior to receiving the revised ultrasound report)  Prog = 1.9 (from yesterday) which may be indicative of ovulation.  She will plan to repeat progesterone this afternoon so that we can assess the trend.  She wonders what could be done next cycle so that she doesn't miss it, if she did indeed ovulate, could have midcycle earlier or start CC earlier days 3-7.  She states that she was supposed to start Letrozole this cycle but changed to Clomid since she was OOT.  She will plan Letrozole next cycle and we can determine what day to bring her back for midcycle.

## 2014-11-30 ENCOUNTER — Telehealth: Payer: Self-pay | Admitting: Reproductive Endocrinology and Infertility

## 2014-11-30 NOTE — Telephone Encounter (Signed)
TC with pt, P4 from yesterday at [facility] is 3.1 which is trending up and confirmatory that ovulation has occurred.  Will plan Letrozole cycle next month starting meds on CD 3 with probable earlier ultrasound.

## 2014-12-15 ENCOUNTER — Encounter: Payer: Self-pay | Admitting: Reproductive Endocrinology and Infertility

## 2015-01-08 ENCOUNTER — Telehealth: Payer: Self-pay | Admitting: Reproductive Endocrinology and Infertility

## 2015-01-08 NOTE — Telephone Encounter (Signed)
Left msg to call me any time today or this week except Friday afternoon when I will be in surgery

## 2015-01-10 ENCOUNTER — Encounter: Payer: Self-pay | Admitting: Reproductive Endocrinology and Infertility

## 2015-01-10 NOTE — Progress Notes (Signed)
REI SURGERY SCHEDULING FORM     Surgeon: Trina Ao, MD    Name:  Lori Atkinson    DOB:  [birth year]                     Diagnosis:Endometriosis and uterine fibroids     Surgery: Robot assisted excision of endometriotic cysts, lesions and removal of uterine fibroids    If BMI > 45, patient should be scheduled for [facility]  Surgery location: [facility]    Additional medical condition: ICD9: none    Admission type:ASU    Types of anesthesia: General    Estimated length of case: 4 hours    OK for Thursday AM:  no    Allergies:   Allergies   Allergen Reactions    Sulfa Antibiotics Hives       LMP:  No LMP recorded.    Is surgery menses sensitive?  No    Resident needed:  Yes    Foster fellow needed: Yes    Comments:N/A    Preadmission testing:    Pre-operative testing: none    Clearance appointments/Consults: No    Special equipment: n/a    Post op appointment needed:  Yes

## 2015-01-12 ENCOUNTER — Other Ambulatory Visit: Payer: Self-pay | Admitting: Reproductive Endocrinology and Infertility

## 2015-01-13 ENCOUNTER — Encounter: Payer: Self-pay | Admitting: Gastroenterology

## 2015-01-31 ENCOUNTER — Encounter: Payer: Self-pay | Admitting: Reproductive Endocrinology and Infertility

## 2015-01-31 ENCOUNTER — Ambulatory Visit: Payer: Self-pay | Admitting: Reproductive Endocrinology and Infertility

## 2015-01-31 VITALS — BP 119/77 | HR 72 | Ht 68.0 in | Wt 163.0 lb

## 2015-01-31 DIAGNOSIS — D219 Benign neoplasm of connective and other soft tissue, unspecified: Secondary | ICD-10-CM

## 2015-01-31 DIAGNOSIS — R102 Pelvic and perineal pain: Secondary | ICD-10-CM

## 2015-01-31 DIAGNOSIS — N809 Endometriosis, unspecified: Secondary | ICD-10-CM

## 2015-01-31 NOTE — Progress Notes (Signed)
REI FOLLOW-UP VISIT    Lori Atkinson is a 40 y.o. G1P0010 who presents for follow up    She is requesting resection of endometriomas and myomectomy during that surgery.    I counseled Lori Atkinson about the estimated risk of cancer of 0.1 to 0.2% and also of possibility of spontaneous regression of the fibroids with menopause.    I discussed the options of doing nothing, hysterectomy, medical management, myomectomy, [place] and HIFU destruction of fibroids.  I informed her that HIFU is not available in the region currently.    We discussed the risks of surgery including but not limited to anesthesia related risks, risk of injury to abdominal and pelvic structures such as bowel, bladder, blood vessels and ureters, recovery time and potential morbidity.  The approach could be laparotomy, mini-laparotomy, minimally invasive with extension of an incision to facilitate removal of the fibroid or minimally invasive with power morcellation.      We discussed that in the event the fibroid is cancer, the use of morcellation can further worsen the prognosis and in this institution we have limited morcellation to be performed within a containment bag to decrease the risk further and also decrease the risk of disseminated peritoneal leiomyosis.  I highlighted that we do not know the effectiveess of this approach in achieving this safety.  I highlighted the limited evidence for using MRI and LDH to predict the presence of leiomyosarcoma in a presumed fibroid.      The alternatives to complete minimally invasive approach mentioned earlier may necessitate a larger incision in the abdomen with need to stay in the hospital for a longer period.  In addition this approach may increase the risk of other complications such as deep vein thrombosis, pulmonary embolism, temporary decrease in bowel function and hospital acquired infection.      We discussed that the current recommendation is to wait for 1.5- 3 months before attempting pregnancy.  All  her questions were answered to the best of my knowledge.    With respect to endometriomas counseled Lori Atkinson about the small risk of cancer.  I discussed the options of doing nothing and medical management.        Lori Atkinson's medications, allergies, and past medical, surgical, family and social history reviewed and updated.      REVIEW OF SYSTEMS  Negative except for HPI    PHYSICAL EXAM  Blood pressure 119/77, pulse 72, height 1.727 m (5\' 8" ), weight 73.9 kg (163 lb), last menstrual period 01/31/2015. Body mass index is 24.78 kg/(m^2).      ASSESSMENT  40 yo with bilateral endometriomas and uterine fibroids    PLAN  Robot assisted bilateral ovarian cystectomy and myomectomy  Consents      The duration of today's visit was 35 minutes, the majority of which was dedicated to counseling and coordination of care.     Trina Ao, MD

## 2015-01-31 NOTE — Preop H&P (Signed)
AMBULATORY  Chief Complaint:   Pelvic pain secondary to bilateral endometriomas and presumed pelvic endometriosis    History of Present Illness:  HPI    No past medical history on file.  Past Surgical History   Procedure Laterality Date    Finger surgery Left 2011     Family History   Problem Relation Age of Onset    Diabetes Father     Hypertension Father     Elevated lipids Father     Stroke Maternal Grandmother     Elevated lipids Mother      Social History     Social History    Marital status: Married     Spouse name: N/A    Number of children: N/A    Years of education: N/A     Occupational History    Radiologist      Social History Main Topics    Smoking status: Never Smoker    Smokeless tobacco: Never Used    Alcohol use 0.0 oz/week     0 Standard drinks or equivalent per week      Comment: occasional    Drug use: No    Sexual activity: Yes     Partners: Male     Other Topics Concern    None     Social History Narrative       Allergies:   Allergies   Allergen Reactions    Sulfa Antibiotics Hives       Medications:  Current Outpatient Prescriptions   Medication    cetirizine (ZYRTEC) 5 MG tablet    clomiPHENE (CLOMID) 50 MG tablet     No current facility-administered medications for this visit.         Review of Systems:   ROS  None    Blood pressure 119/77, pulse 72, height 1.727 m (5\' 8" ), weight 73.9 kg (163 lb), last menstrual period 01/31/2015.    Physical Exam  General: well appearing  HEENT:  no thyromegaly   Heart: S1S2 RRR  Lungs: CTA b/l  Abdomen: soft, NT  Pelvic: deferred  Extremities: No edema      Radiology impressions (last 30 days):  Pelvic ultrasound at [facility] demonstrates persistent and increasing bilateral ovarian endometriomas    Currently Active Problems:  There is no problem list on file for this patient.       Assessment:   40 yo with bilateral ovarian endometriomas and worsening pelvic pain    Plan:   Laparoscopic removal of endometriomas and possible  excision/ablation of endometriosis  Consent    Author: Trina Ao, MD  Note created: 01/31/2015  at: 2:32 PM

## 2015-02-12 ENCOUNTER — Encounter: Payer: Self-pay | Admitting: Reproductive Endocrinology and Infertility

## 2015-02-12 NOTE — Anesthesia Preprocedure Evaluation (Addendum)
Anesthesia Pre-operative History and Physical for Lori Atkinson    ______________________________________________________________________________________    Summary:  40 y.o. female with Endometriosis (N80.9)  Uterine fibroid (D25.9) presenting for Procedure(s):  (Robotic) assisted excision of endometriotic cysts, lesions, and removal of fibroid,), Resident & Migs Fellow needed by Surgeon(s):  Trina Ao, MD scheduled for 270 minutes.    By Ruel Favors, CRNA at 1:38 PM on 02/12/2015    <[email or web address]>  Anesthesia Evaluation Information Source: patient, family, records     ANESTHESIA  Pertinent(-):  history of anesthetic complications, Family Hx of Anesthetic Complications    GENERAL  Pertinent (-):  substance abuse, history of anesthetic complications, Family Hx of Anesthetic Complications    HEENT    + Corrective Eyewear            glasses    + Sinus Issues            allergic rhinitis  Pertinent (-):   TMJD, neck pain PULMONARY    + Environmental Allergies    + Snoring  Pertinent(-): smoking, asthma, recent URI, sleep apnea    CARDIOVASCULAR  Good(4+METs) Exercise Tolerance  Pertinent(-):  hypertension, past MI, dysrhythmias, CHF    GI/HEPATIC/RENAL  Last PO Intake: >8hr before procedure    + GERD (rare- none currently)            well controlled    + Alcohol use            social  Pertinent(-):  liver  issues, renal issues NEURO/PSYCH  Pertinent(-):  headaches, dizziness/motion sickness, syncope, chronic pain, seizures, cerebrovascular event, neuro deficit    ENDO/OTHER    + Hormone Use            BCPs    + Menstruating            regularly  Pertinent(-):  diabetes mellitus, thyroid disease, steroid use, chemo Hx    HEMALOGIC  Pertinent(-):  bruises/bleeds easily, coagulopathy, autoimmune disease, arthritis       Physical Exam    Airway            Mouth opening: normal            Mallampati: II            TM distance (fb): >3 FB            Neck ROM: full  Dental   Normal Exam   Cardiovascular            Rhythm: regular           Rate: normal       Pulmonary     breath sounds clear to auscultation    Mental Status     oriented to person, place and time       ________________________________________________________________________  Plan  ASA Score  2  Anesthetic Plan general    Induction (routine IV); General Anesthesia/Sedation Maintenance Plan (inhaled agents, IV bolus and neuromuscular blockade);  Airway Manipulation (direct laryngoscopy); Airway (cuffed ETT); Line ( use current access and additional large bore IV); Monitoring (standard ASA); Positioning (supine); PONV Plan (dexamethasone, ondansetron, haloperidol and propofol infusion); Pain (per surgical team); PostOp (PACU)    Informed Consent     Risks:          Risks discussed were commensurate with the plan listed above with the following specific points: N/V, aspiration, sore throat and hypotension , damage to:(eyes, nerves, teeth), awareness, unexpected serious injury, allergic Rx, death    Anesthetic  Consent:      Anesthetic plan (and risks as noted above) were discussed with patient and spouse    Blood products Consent:        Use of blood products discussed with: patient and spouse and they consented    Plan also discussed with team members including:  CRNA    Attending Attestation:  As the primary attending anesthesiologist, I attest that the patient or proxy understands and accepts the risks and benefits of the anesthesia plan. I also attest that I have personally performed a pre-anesthetic examination and evaluation, and prescribed the anesthetic plan for this particular location within 48 hours prior to the anesthetic as documented.

## 2015-02-13 ENCOUNTER — Encounter: Payer: Self-pay | Admitting: Anesthesiology

## 2015-02-13 ENCOUNTER — Ambulatory Visit
Admission: RE | Admit: 2015-02-13 | Discharge: 2015-02-13 | Disposition: A | Discharge status: Home / Self Care | Payer: Self-pay | Source: Ambulatory Visit | Attending: Reproductive Endocrinology and Infertility | Admitting: Reproductive Endocrinology and Infertility

## 2015-02-13 ENCOUNTER — Encounter
Admission: RE | Disposition: A | Discharge status: Home / Self Care | Payer: Self-pay | Source: Ambulatory Visit | Attending: Reproductive Endocrinology and Infertility

## 2015-02-13 ENCOUNTER — Encounter: Payer: Self-pay | Admitting: Reproductive Endocrinology and Infertility

## 2015-02-13 DIAGNOSIS — Z9889 Other specified postprocedural states: Secondary | ICD-10-CM

## 2015-02-13 HISTORY — PX: PR LAP,MYOMECTOMY 1-4,TOT WT 250 GMS: 58545

## 2015-02-13 HISTORY — PX: PR LAPS MYOMECTOMY EXC 1-4 MYOMAS 250 GM/<: 58545

## 2015-02-13 LAB — POCT URINE PREGNANCY: Lot #: 161321

## 2015-02-13 SURGERY — MYOMECTOMY, ROBOT-ASSISTED
Anesthesia: General | Site: Pelvis | Wound class: Clean

## 2015-02-13 MED ORDER — HYDROMORPHONE HCL 2 MG/ML IJ SOLN *WRAPPED*
0.4000 mg | INTRAMUSCULAR | Status: DC | PRN
Start: 2015-02-13 — End: 2015-02-13
  Administered 2015-02-13: 0.4 mg via INTRAVENOUS

## 2015-02-13 MED ORDER — OXYCODONE-ACETAMINOPHEN 5-325 MG PO TABS *I*
2.0000 | ORAL_TABLET | Freq: Once | ORAL | Status: AC | PRN
Start: 2015-02-13 — End: 2015-02-13
  Administered 2015-02-13: 2 via ORAL

## 2015-02-13 MED ORDER — HYDROMORPHONE HCL 2 MG/ML IJ SOLN *WRAPPED*
INTRAMUSCULAR | Status: AC
Start: 2015-02-13 — End: 2015-02-13
  Filled 2015-02-13: qty 1

## 2015-02-13 MED ORDER — KETOROLAC TROMETHAMINE 30 MG/ML IJ SOLN *I*
INTRAMUSCULAR | Status: DC | PRN
Start: 2015-02-13 — End: 2015-02-13
  Administered 2015-02-13: 30 mg via INTRAVENOUS

## 2015-02-13 MED ORDER — MIDAZOLAM HCL 1 MG/ML IJ SOLN *I* WRAPPED
INTRAMUSCULAR | Status: DC | PRN
Start: 2015-02-13 — End: 2015-02-13
  Administered 2015-02-13: 2 mg via INTRAVENOUS

## 2015-02-13 MED ORDER — ONDANSETRON HCL 2 MG/ML IV SOLN *I*
INTRAMUSCULAR | Status: DC | PRN
Start: 2015-02-13 — End: 2015-02-13
  Administered 2015-02-13: 4 mg via INTRAMUSCULAR

## 2015-02-13 MED ORDER — FENTANYL CITRATE 50 MCG/ML IJ SOLN *WRAPPED*
INTRAMUSCULAR | Status: DC | PRN
Start: 2015-02-13 — End: 2015-02-13
  Administered 2015-02-13: 50 ug via INTRAVENOUS
  Administered 2015-02-13 (×2): 100 ug via INTRAVENOUS

## 2015-02-13 MED ORDER — ONDANSETRON HCL 2 MG/ML IV SOLN *I*
INTRAMUSCULAR | Status: AC
Start: 2015-02-13 — End: 2015-02-13
  Filled 2015-02-13: qty 2

## 2015-02-13 MED ORDER — BUPIVACAINE HCL 0.25% IJ SOLN 30ML STERI-PAK *I*
INTRAMUSCULAR | Status: DC | PRN
Start: 2015-02-13 — End: 2015-02-13
  Administered 2015-02-13: 20 mL via SUBCUTANEOUS

## 2015-02-13 MED ORDER — IBUPROFEN 800 MG PO TABS *I*
800.0000 mg | ORAL_TABLET | Freq: Three times a day (TID) | ORAL | 0 refills | Status: AC | PRN
Start: 2015-02-13 — End: 2015-03-15

## 2015-02-13 MED ORDER — ROCURONIUM BROMIDE 10 MG/ML IV SOLN *WRAPPED*
Status: DC | PRN
Start: 2015-02-13 — End: 2015-02-13
  Administered 2015-02-13: 50 mg via INTRAVENOUS

## 2015-02-13 MED ORDER — LIDOCAINE HCL 1 % IJ SOLN *I*
0.1000 mL | INTRAMUSCULAR | Status: DC | PRN
Start: 2015-02-13 — End: 2015-02-13
  Administered 2015-02-13: 0.1 mL via SUBCUTANEOUS

## 2015-02-13 MED ORDER — GLYCOPYRROLATE 0.2 MG/ML IJ SOLN *I*
INTRAMUSCULAR | Status: DC | PRN
  Administered 2015-02-13: 0.4 mg via INTRAVENOUS

## 2015-02-13 MED ORDER — LACTATED RINGERS IV SOLN *I*
20.0000 mL/h | INTRAVENOUS | Status: DC
Start: 2015-02-13 — End: 2015-02-13
  Administered 2015-02-13: 20 mL/h via INTRAVENOUS

## 2015-02-13 MED ORDER — LACTATED RINGERS IV SOLN *I*
INTRAVENOUS | Status: DC | PRN
Start: 2015-02-13 — End: 2015-02-13

## 2015-02-13 MED ORDER — PROMETHAZINE HCL 25 MG/ML IJ SOLN *I*
6.2500 mg | Freq: Once | INTRAMUSCULAR | Status: DC | PRN
Start: 2015-02-13 — End: 2015-02-13

## 2015-02-13 MED ORDER — HALOPERIDOL LACTATE 5 MG/ML IJ SOLN *I*
INTRAMUSCULAR | Status: DC | PRN
Start: 2015-02-13 — End: 2015-02-13
  Administered 2015-02-13: 1 mg via INTRAVENOUS

## 2015-02-13 MED ORDER — SODIUM CHLORIDE 0.9 % IV SOLN WRAPPED *I*
20.0000 mL/h | Status: DC
Start: 2015-02-13 — End: 2015-02-13

## 2015-02-13 MED ORDER — OXYCODONE-ACETAMINOPHEN 5-325 MG PO TABS *I*
ORAL_TABLET | ORAL | Status: AC
Start: 2015-02-13 — End: 2015-02-13
  Filled 2015-02-13: qty 2

## 2015-02-13 MED ORDER — ONDANSETRON HCL 2 MG/ML IV SOLN *I*
1.0000 mg | Freq: Once | INTRAMUSCULAR | Status: AC | PRN
Start: 2015-02-13 — End: 2015-02-13
  Administered 2015-02-13: 1 mg via INTRAVENOUS

## 2015-02-13 MED ORDER — VASOPRESSIN 20 UNIT/ML IJ SOLN *WRAPPED*
Status: DC | PRN
Start: 2015-02-13 — End: 2015-02-13
  Administered 2015-02-13: 30 mL via INTRAMUSCULAR

## 2015-02-13 MED ORDER — KETOROLAC TROMETHAMINE 30 MG/ML IJ SOLN *I*
INTRAMUSCULAR | Status: AC
Start: 2015-02-13 — End: 2015-02-13
  Filled 2015-02-13: qty 1

## 2015-02-13 MED ORDER — DEXAMETHASONE SODIUM PHOSPHATE 4 MG/ML INJ SOLN *WRAPPED*
INTRAMUSCULAR | Status: DC | PRN
Start: 2015-02-13 — End: 2015-02-13
  Administered 2015-02-13: 4 mg via INTRAVENOUS

## 2015-02-13 MED ORDER — LACTATED RINGERS IV BOLUS *I*
250.0000 mL | Freq: Once | INTRAVENOUS | Status: DC
Start: 2015-02-13 — End: 2015-02-14

## 2015-02-13 MED ORDER — NEOSTIGMINE METHYLSULFATE 10 MG/10ML IV SOLN *I*
INTRAVENOUS | Status: DC | PRN
  Administered 2015-02-13: 3 mg via INTRAVENOUS

## 2015-02-13 MED ORDER — PROPOFOL 10 MG/ML IV EMUL (INTERMITTENT DOSING) WRAPPED *I*
INTRAVENOUS | Status: DC | PRN
Start: 2015-02-13 — End: 2015-02-13
  Administered 2015-02-13: 160 mg via INTRAVENOUS

## 2015-02-13 MED ORDER — VECURONIUM BROMIDE 20 MG IV SOLR *I*
INTRAVENOUS | Status: DC | PRN
  Administered 2015-02-13: 2 mg via INTRAVENOUS
  Administered 2015-02-13: 10 mg via INTRAVENOUS

## 2015-02-13 MED ORDER — HALOPERIDOL LACTATE 5 MG/ML IJ SOLN *I*
0.5000 mg | Freq: Once | INTRAMUSCULAR | Status: AC | PRN
Start: 2015-02-13 — End: 2015-02-13
  Administered 2015-02-13: 0.5 mg via INTRAVENOUS

## 2015-02-13 MED ORDER — HALOPERIDOL LACTATE 5 MG/ML IJ SOLN *I*
INTRAMUSCULAR | Status: AC
Start: 2015-02-13 — End: 2015-02-13
  Filled 2015-02-13: qty 1

## 2015-02-13 MED ORDER — LIDOCAINE HCL 2 % IJ SOLN *I*
INTRAMUSCULAR | Status: DC | PRN
Start: 2015-02-13 — End: 2015-02-13
  Administered 2015-02-13: 80 mg via INTRAVENOUS

## 2015-02-13 MED ORDER — PROPOFOL INFUSION 10 MG/ML *I*
INTRAVENOUS | Status: DC | PRN
Start: 2015-02-13 — End: 2015-02-13
  Administered 2015-02-13: 15 ug/kg/min via INTRAVENOUS
  Administered 2015-02-13: 10 ug/kg/min via INTRAVENOUS

## 2015-02-13 MED ORDER — HYDROMORPHONE HCL PF 1 MG/ML IJ SOLN *WRAPPED*
INTRAMUSCULAR | Status: DC | PRN
Start: 2015-02-13 — End: 2015-02-13
  Administered 2015-02-13 (×2): 1 mg via INTRAVENOUS

## 2015-02-13 SURGICAL SUPPLY — 77 items
ADHESIVE SKIN CLOSURE 0.7ML DERMABOND ADVANCED (Dressing) ×2 IMPLANT
APPLICATOR CHLORAPREP 26ML ORANGE LARGE (Solution) ×2 IMPLANT
APPLICATOR HEMSTAT MTRX ENDOSCP CANN SS STYL N DEHP FLOSEAL (Supply) IMPLANT
APPLIER LIGACLIP 20 5MM (Supply) IMPLANT
BAG SPEC RETRV 224ML W4XL6IN DIA10MM ENDOPCH RETRV (Supply) IMPLANT
BAG SPECIMEN RETRIEVAL 12/15MM (Supply) IMPLANT
BLADE SURG CARBON STEEL #10 STER (Supply) ×4 IMPLANT
CANNULA XI SEAL 5-8MM (Other) ×6 IMPLANT
CATH ZUMI 4.5 (Other)
CLIP SUTR LAPRA-TY ENDO 2-0 3-0 4-0 CTD (Supply) IMPLANT
COVER MAYO STAND (Drape) ×2
COVER MAYO STD W24XL53IN 3 LAYR SMS RECOIL TECH DISP (Drape) ×2 IMPLANT
COVER TIP ACCESSORY FOR DAVINC (Other) ×4 IMPLANT
DRAPE CLMN W14XH6.5XL5IN FOR DA VINCI XI ROBOTIC SYS (Drape) ×1 IMPLANT
DRAPE UTILITY W/TAPE 15X26 (Drape) ×4 IMPLANT
DRAPE XI COLUMN (Drape) ×1
DRAPE XI INSTRUMENT ARM (Drape) ×8 IMPLANT
DRESSING TEGADERM 4 X 4 3/4IN (Dressing) ×2 IMPLANT
DRIVER NEEDLE MEGA XI (Supply) ×2 IMPLANT
FILTER NEPTUNE 4PORT MANIFOLD (Supply) ×2 IMPLANT
FORCEPS BIPOLAR FENESTRATED DAVINCI XI DISCONTINUED USE 262945 (Other) ×2 IMPLANT
GELPOINT (Other) ×1
GLOVE BIOGEL PI INDICATOR UNDER SZ 7 LF (Glove) ×12 IMPLANT
GLOVE BIOGEL PI MICRO IND UNDER SZ 6.5 LF (Glove) ×8 IMPLANT
GLOVE BIOGEL PI MICRO SZ 7 (Glove) ×18 IMPLANT
GLOVE BIOGEL PI ORTHOPRO SZ 7.0 LF (Glove) ×6 IMPLANT
GOWN SIRIUS RAGLAN NONREINFORCED XL (Gown) ×2 IMPLANT
HANDLE LIGHT STERILE (Supply) ×2 IMPLANT
HANDLE LITE EZ RIGID NL (Supply) ×2 IMPLANT
HANDPIECE S/I 5MM X 32CM CANNULA DISP (Supply) ×2 IMPLANT
KIT HEMSTAT MTRX 5ML BOV THROM W/ NDL FREE ADPT LUERLOCK APPL TIP FLOSEAL (Supply) IMPLANT
MANIPULATOR CERV VCARE DX (Other) IMPLANT
MANIPULATOR SUR W4.5MMXL33CM UTER INJ (Other) IMPLANT
NEEDLE ENDOSC RIGID 3.8FR X 14.5IN (Needle) ×2 IMPLANT
NEEDLE FILTER 5 MICRON 19G (Needle) ×1 IMPLANT
NEEDLE FILTER 5 MICRON 19G 19G (Needle) ×1
NEEDLE PNEUMOPERITONEUM 120 (Needle) ×2 IMPLANT
PACK CUSTOM GYN LAP DAVINCI CDS (Pack) ×2 IMPLANT
PACK TOWEL LIGHT BLUE STERILE (Supply) ×4 IMPLANT
PAD OB W/ADH STRIP 11IN (Dressing) ×2 IMPLANT
PLATFORM ADV ACCS W/ GELSEAL CAP ALEXIS WND PROTCT/RETRCT REM TETH SLV INTRO INSTR SHLD (Other) ×1 IMPLANT
POUCH ENDO 10MM (Supply)
POUCH ENDOCATCH II LG 15MM (Other) IMPLANT
PROTECTOR ULNA NERVE ~~LOC~~ (Supply) ×2 IMPLANT
SCISSORS CVD MONOPOLAR DAVINCI XI (Other) ×2 IMPLANT
SEALER VESSEL XI DAVINCI (Other) IMPLANT
SOL LACT RINGER INJ 1000ML BAG (Drug) ×2 IMPLANT
SOL SOD CHL IRRIG .9PCT 1000ML BAG (Solution) ×2 IMPLANT
SOL SOD CHL IRRIG 500ML BTL (Solution) ×2 IMPLANT
SPIKE MINI DISPENSING PIN (BBRAUN DP1000) (Supply) ×2 IMPLANT
SPONGE GAUZE 2 X 2 STER (Dressing) ×2 IMPLANT
SPONGE LAP 12X12 STERILE 5 PACK (Sponge) ×2 IMPLANT
SUCTION YANKAUER TUBE P73102 (Supply) ×2 IMPLANT
SUTR VICRYL 2-0 SH CT 27 IN (Suture) ×2 IMPLANT
SUTR VICRYL ANTIB 0 UR-6 27 VIOLET (Suture) IMPLANT
SUTR VICRYL CTD 0 CT-1 27 VIOLET (Suture) ×6 IMPLANT
SUTR VLOC 180 SZ 2-0 9IN GS-22 GREEN (Suture) IMPLANT
SUTR VLOC NEEDLE 3-0 V-20 6IN (Suture) ×6 IMPLANT
SYRINGE 10CC LUERLOCK LF USE 221755 (Supply) ×2 IMPLANT
SYRINGE 50ML 2 OZ CATH-TIP NL (Supply) ×2 IMPLANT
SYRINGE LUER SLIP 5CC LF (Supply) ×2 IMPLANT
SYRINGE LUER SLIP 60CC LF (Supply) ×4 IMPLANT
SYRINGE LUERLOCK 30ML INDIVIDUAL WRAP (Supply) ×4 IMPLANT
SYRINGE LUERLOCK 50 ML INDIVIDUAL WRAP (Supply) IMPLANT
SYRINGE MED 50ML W OUT NDL LUERSLIP TIP LF (Supply) ×2 IMPLANT
SYRINGE MED 5CC W OUT NDL LUERSLIP CLEAR LF (Supply) ×1 IMPLANT
SYSTEM CONTAINED EXTRACT ALEXIS 6500ML (Supply) IMPLANT
SYSTEM SURGICAL FASCIA CLOSURE CARTER-THOMASON (Suture) IMPLANT
TIP UTERINE MANIPULATOR 10CM (Other) IMPLANT
TIP UTERINE MANIPULATOR 5.1MM (Other) IMPLANT
TIP UTERINE MANIPULATOR 5.1MMX3.75CM Y (Other) IMPLANT
TIP UTERINE MANIPULATOR 6.7MM (Other) IMPLANT
TIP UTERINE MANIPULATOR 6.7MMX12CM O (Other) IMPLANT
TIP UTERINE MANIPULATOR 8CM (Other) ×2 IMPLANT
TUBING HIGHFLOW SUCT (Tubing) ×2 IMPLANT
TUBING IRR. BLADDER CYSTO (Tubing) IMPLANT
TUBING NONCONDUC CONN 12FT X 3/16IN (Tubing) ×2 IMPLANT

## 2015-02-13 NOTE — Discharge Instructions (Addendum)
DISCHARGE INSTRUCTIONS  Davinci Assisted Procedure    Brief Hospital Course: You presented for your scheduled Davinci assisted laparoscopic myomectomy (removal of fibroid), removal of bilateral endometriomas and excision of endometriosis lesions. Your surgery was uncomplicated and you were discharged home in stable condition with plan for office follow-up.    Diet after surgery   - You may feel nauseated from the surgery or anesthesia. Advance diet as tolerated.     After Surgery Care:  - You can resume normal activities including stairs, gradually increase walking as tolerated.  - Hygiene:  You may shower but do not take tub baths until cleared by your doctor   - Driving: you may drive when okay per your doctor, do not drive while on narcotics   - Exercise: Do not resume vigorous exercise for 6 weeks. Do not lift heavy objects or children (nothing more that 15 pounds) until okay per your doctor, after your follow-up appointment.   - Intercourse: Nothing in the vagina (no tampons, douching or intercourse) until cleared by your doctor.   - Wound Care: Remove the dressing over your belly button tomorrow (02/14/15). Please shower daily. Use soap and water to wash your body. Do not scrub your incisions, but let soapy water run over them. Once you are done washing, let clean water fall directly from the shower head onto your incision. Once out of the shower, dry your body off with a towel. With a different, clean towel that no one else has used, pat your incisions dry. Use a clean dry towel every day on your incisions.     Also,  A) DO NOT drive or operate any machinery until completely off narcotics   B) DO NOT drink alcoholic beverages while taking narcotic pain medications.   C) DO NOT make major decisions, sign contracts, etc for 24 hours   Please continue to adhere to these precautions if you are taking narcotic medication.     Discomfort after surgery:   - It is normal to have mild to moderate abdominal cramping or  gas pains for a few days to a few weeks   - It is normal to have mild pain along your incisions   - It is normal to have shoulder or neck pain (caused by gas in your abdomen)   - Ambulating or a heating pad set on low may help relieve gas pains   - Use prescribed medications for pain     Medications See medication reconciliation sheet     Call your OB/GYN for chest pain, nausea/vomiting/diarrhea, incisional opening, constipation unrelieved by medications, fever, shaking or chills, pain not controlled by meds, lightheadedness, dizziness, or fainting, shortness of breath or wheezing, difficulty or pain with urination, bleeding or drainage from your incision, foul smelling discharge, heavy vaginal bleeding (soaking through 1 pad every 1-2 hours) or with any other concerns or questions.     If you have an emergency and are unable to contact your doctor, you should come in to be seen at the emergency department.

## 2015-02-13 NOTE — Anesthesia Postprocedure Evaluation (Signed)
Anesthesia Post-Op Note    Patient: Lori Atkinson    Procedure(s) Performed:  Procedure Summary     Date Anesthesia Start Anesthesia Stop Room / Location    02/13/15 [id number] / [facility] MAIN OR       Procedure Diagnosis Surgeon Attending Anesthesia    Robotic  assisted laparoscopic  excision of endometriosis, removal of uterine fibroid, bilateral ovarian cystectomies (N/A Pelvis) Endometriosis; Uterine fibroid  (Endometriosis [N80.9]; Uterine fibroid [D25.9]) Trina Ao, MD Terese Door, MD        Recovery Vitals  BP: [phone] (02/13/2015  2:15 PM)  Heart Rate: 85 (02/13/2015  2:15 PM)  Heart Rate (via Pulse Ox): 72 (02/13/2015 12:45 PM)  Resp: 15 (02/13/2015  2:15 PM)  Temp: 36.6 C (97.9 F) (02/13/2015  2:00 PM)  SpO2: 98 % (02/13/2015  2:15 PM)  O2 Device: None (Room air) (02/13/2015  2:15 PM)  O2 Flow Rate: 2 L/min (02/13/2015  1:15 PM)   0-10 Scale: 0 (02/13/2015  1:30 PM)  Anesthesia type:  General  Complications Noted During Procedure or in PACU:  None   Comment:    Patient Location:  PACU  Level of Consciousness:    Recovered to baseline  Patient Participation:     Able to participate  Temperature Status:    Normothermic  Oxygen Saturation:    Within patient's normal range  Cardiac Status:   Within patient's normal range  Fluid Status:    Stable  Airway Patency:     Yes  Pulmonary Status:    Baseline and stable  Pain Management:    Adequate analgesia  Nausea and Vomiting:  None    Post Op Assessment:    Tolerated procedure well   Attending Attestation:  All indicated post anesthesia care provided     -

## 2015-02-13 NOTE — Op Note (Signed)
Operative Note (Surgical Log ID: [id number])       Date of Surgery: 02/13/2015       Surgeons: Surgeon(s) and Role:     * Trina Ao, MD - Primary     * Fayette Pho, MD - Resident - Assisting       Pre-op Diagnosis: Pre-Op Diagnosis Codes:     * Endometriosis [N80.9]     * Uterine fibroid [D25.9]       Post-op Diagnosis: Post-Op Diagnosis Codes:     * Endometriosis [N80.9]     * Uterine fibroid [D25.9]       Procedure(s) Performed: Procedure:    Robotic  assisted laparoscopic  excision of endometriosis, removal of uterine fibroid, bilateral ovarian cystectomies  CPT(R) Code:  [id number] - PR LAP,MYOMECTOMY 1-4,TOT WT 250 GMS         Additional CPT Codes: [id number]: Pr Lap,Fulgurate/Excise Lesions;         Anesthesia Type: General        Fluid Totals: I/O this shift:  01/31 0700 - 01/31 1459  In: 2900 (39.7 mL/kg) [I.V.:2900]  Out: 890 (12.2 mL/kg) [Urine:850; Blood:40]  Net: 2010  Weight: 73 kg        Estimated Blood Loss: Blood Loss: 40 mL       Specimens to Pathology:    ID Type Source Tests Collected by Time Destination   A : uterine surface endometriosis TISSUE Tissue SURGICAL PATHOLOGY Wanita Chamberlain, RN 02/13/2015 [id number]    B : uterine fibroid TISSUE Tissue SURGICAL PATHOLOGY Wanita Chamberlain, RN 02/13/2015 [id number]    C : persistant right ovarian cyst TISSUE Tissue SURGICAL PATHOLOGY Wanita Chamberlain, RN 02/13/2015 1034    D : right ovarian endometrioma TISSUE Tissue SURGICAL PATHOLOGY Wanita Chamberlain, RN 02/13/2015 1046    E : left ovarian cyst TISSUE Tissue SURGICAL PATHOLOGY Wanita Chamberlain, RN 02/13/2015 1055    F : Right lateral pelvic wall endometrotic lesion TISSUE Tissue SURGICAL PATHOLOGY Linus Mako, RN 02/13/2015 1124           Temporary Implants: none       Packing:  none               Patient Condition: good       Indications: Pelvic pain secondary to endometrioma       Findings (Including unexpected complications): Normal external genitalia, urethral meatus, vagina and cervix  Bilaterally enlarged  ovaries with large endometrioma on the left ovary and one small endometrioma in addition to two small clear cysts on the right ovary  Endometriotic lesions noted in the right obliterated umbilical ligament as well as superolateral to right USL/inferomedial right ovarian fossa.  Normal and patent tubes on both sides  One myoma of 4 cm size noted  Location of myomas: posterior intramura/subserosal  Appendix and liver normal     Recommendation for Future Pregnancy:  Trial of vaginal delivery    Description of Procedure:  The patient was identified, consented and taken to the operating room.  General anesthesia was administered without difficulty. She was placed in dorsal lithotomy position with her arms tucked by her sides.  She was prepped and draped in the normal sterile fashion.  A time out was performed.   A foley catheter was placed in the bladder.   A speculum was placed in the vagina.  The anterior lip of the cervix was grasped with a tenaculum.  The uterus was sounded.  The cervix was progressively dilated  to 19 pratt.  The RUMI was placed without difficulty.  The tenaculum and speculum were removed.    Attention was then turned to the abdomen.  All skin incisions were injected with 0.25% Sensorcaine prior to creating the incisions.  An 8 mm subxiphoid incision was created and the peritoneal cavity insufflated with CO2 to a pressure of 18mm Hg.  An 2mm robot port introduced through this incision. The abdominal cavity was entered without difficulty.   The robotic telescopic lens was introduced in through this port.  A 25 mm omega shaped umbilical incision was created.  The GelPOINT advanced access platform was placed and secured.   An 8 mm incision was created on the right lateral side of the abdomen at the level of the umbilicus.  A 46mm robot port was advanced under direct visualization without evidence of injury.  The same steps were repeated on the left lateral side of the abdomen above the level of the  umbilicus.  The intraperitoneal pressure was decreased to 15 mmHg.  The patient was placed in steep trendelenburg position and the robot was docked.    Pelvic survey was performed with the above findings noted.  The myoma was injected with a dilute vasopressin solution.  A horizontal serosal incision overlying the myoma was created with monopolar scissors and carried down to the layer of the capsule.  The myoma was grasped with a tenaculum and the myoma was dissected from the capsule using blunt and sharp dissection.  The myoma was removed through the umbilical incision site.  The myometrial defect was closed with running 3-0 VLoc in multiple layers.      Surface endometriotic adhesions were removed from the surface of the uterus, right ovary and left utero-ovarian ligament.  Cautery had to be utilized in some areas.  The four cysts were systematically excised after opening the ovarian cortex overlying the cysts and then removing the cyst walls meticulously leaving no part of the cyst wall behind.  The peritoneum of the lesion in the right pelvic sidewall was carefully lifted off the underlying structures and dissected out ensuring there was no ureter beneath.  The odule on the right umbilical ligament was also carefully removed.    Hemostasis was confirmed especially in both ovarian cortices.  The robot was undocked.    The fascia of the umbilical incision was closed with continuous 0 vicryl.  The carbon dioxide was expelled from the abdomen and the ports were removed with the inner sheath in place to avoid herniation.  The skin incisions were closed with 4-0 vicryl and reinforced with skin glue.  The uterine manipulator and foley catheter were removed.     The patient tolerated the surgery well.  Sponge, needle and instrument counts correct times two.  She was taken to the recovery are   I was present and participated in the entire surgery.      Signed:  Trina Ao, MD  on 02/13/2015 at 12:46 PM

## 2015-02-13 NOTE — Anesthesia Procedure Notes (Signed)
---------------------------------------------------------------------------------------------------------------------------------------    AIRWAY   GENERAL INFORMATION AND STAFF    Patient location during procedure: OR       Date of Procedure: 02/13/2015 7:57 AM  CONDITION PRIOR TO MANIPULATION     Current Airway/Neck Condition:  Normal        For more airway physical exam details, see Anesthesia PreOp Evaluation  AIRWAY METHOD     Patient Position:  Sniffing    Preoxygenated: yes      Induction: IV    Mask Difficulty Assessment:  1 - vent by mask       Mask NMB: 1 - vent by mask      Technique Used for Successful ETT Placement:  Direct laryngoscopy    Intubation device: no stylet.    Blade Type:  Miller    Laryngoscope Blade/Video laryngoscope Blade Size:  2    Cormack-Lehane Classification:  Grade I - full view of glottis    Placement Verified by: capnometry, auscultation, palpation of cuff and equal breath sounds      Number of Attempts at Approach:  1    Number of Other Approaches Attempted:  0  FINAL AIRWAY DETAILS    Final Airway Type:  Endotracheal airway    Final Endotracheal Airway:  ETT      Cuffed: cuffed    Insertion Site:  Oral    ETT Size (mm):  6.5    Distance inserted from Lips (cm):  20  ADDITIONAL COMMENTS   Easy/atr int after DVVC.  +ETCO2 and BBSC=.  Dentition intact as it was.  ----------------------------------------------------------------------------------------------------------------------------------------

## 2015-02-13 NOTE — OR Nursing (Signed)
Pt met in the pre-area and understands the procedure as consented.allergic to sulfa drugs.denies restrictions of ROM or metal implants. [email or web address]

## 2015-02-13 NOTE — Preop H&P (Signed)
UPDATES TO PATIENT'S CONDITION on the DAY OF SURGERY/PROCEDURE    I. Updates to Patient's Condition :    Day of Surgery/Procedure Update:  History  History reviewed and no change    Physical  Physical exam updated and no change    II. Procedure Readiness   I have reviewed the patient's H&P and updated condition.       III. Attestation  I attest that this patient is ready for surgery/procedure.

## 2015-02-13 NOTE — Anesthesia Case Conclusion (Signed)
CASE CONCLUSION  Emergence  Actions:  Suctioned, extubated and OPA  Criteria Used for Airway Removal:  Adequate Tv & RR, acceptable O2 saturation, following commands, strong hand grip and sustained tetany  Assessment:  Routine  Transport  Directly to: PACU  Airway:  Nasal cannula  Oxygen Delivery:  2 lpm  Position:  Recumbent  Patient Condition on Handoff  Level of Consciousness:  Alert/talking/calm  Patient Condition:  Stable  Handoff Report to:  RN  Comments: Pt suctioned and ext after meeting ext criteria- pt maint patent airway.  Dentition intact as it was.  Pt to PACU.  Monitors on, VSS.  IV patent.  Report to RN

## 2015-02-13 NOTE — Progress Notes (Addendum)
Patient returned from PACU via stretcher.  Report obtained from PACU RN.    Patient awake and vital signs stable.  Patient assisted out of stretcher to recliner chair.  Tolerated well.  IV patent.  No complaints of nausea.  Patient reports pain   4/10 to abdomen, percocet administered per [facility].  Incision site  To the umbilicus covered with 2x2 and tagaderm, and is clean,dry & intact.Three lap sites to the abdomen are open to air and also clean and dry. Starting po.  Family called and visiting with patient.Aprroximately 1620 patient ambulated to bathroom but no UOP noted.Patient bladder scanned at noted 329.Dr Clide Deutscher K, notified of the same. MD recommened gently hydration and to do another bladder scan in one hour.Call bell is within reach.  See flow sheets for further details.

## 2015-02-13 NOTE — INTERIM OP NOTE (Signed)
Interim Op Note (Surgical Log ID: [id number])       Date of Surgery: 02/13/2015       Surgeons: Surgeon(s) and Role:     * Trina Ao, MD - Primary     * Fayette Pho, MD - Resident - Assisting       Pre-op Diagnosis: Pre-Op Diagnosis Codes:     * Endometriosis [N80.9]     * Uterine fibroid [D25.9]       Post-op Diagnosis: Post-Op Diagnosis Codes:     * Endometriosis [N80.9]     * Uterine fibroid [D25.9]       Procedure(s) Performed: Procedure:    Robotic  assisted laparoscopic  excision of endometriosis, removal of uterine fibroid, bilateral ovarian cystectomies  CPT(R) Code:  [id number] - PR LAP,MYOMECTOMY 1-4,TOT WT 250 GMS         Additional CPT Codes: [id number]: Pr Lap,Fulgurate/Excise Lesions;         Anesthesia Type: General        Fluid Totals: I/O this shift:  01/31 0700 - 01/31 1459  In: 2900 (39.7 mL/kg) [I.V.:2900]  Out: 890 (12.2 mL/kg) [Urine:850; Blood:40]  Net: 2010  Weight: 73 kg        Estimated Blood Loss: Blood Loss: 40 mL       Specimens to Pathology:    ID Type Source Tests Collected by Time Destination   A : uterine surface endometriosis TISSUE Tissue SURGICAL PATHOLOGY Wanita Chamberlain, RN 02/13/2015 [id number]    B : uterine fibroid TISSUE Tissue SURGICAL PATHOLOGY Wanita Chamberlain, RN 02/13/2015 [id number]    C : persistant right ovarian cyst TISSUE Tissue SURGICAL PATHOLOGY Wanita Chamberlain, RN 02/13/2015 1034    D : right ovarian endometrioma TISSUE Tissue SURGICAL PATHOLOGY Wanita Chamberlain, RN 02/13/2015 1046    E : left ovarian cyst TISSUE Tissue SURGICAL PATHOLOGY Wanita Chamberlain, RN 02/13/2015 1055    F : Right lateral pelvic wall endometrotic lesion TISSUE Tissue SURGICAL PATHOLOGY Linus Mako, RN 02/13/2015 1124           Temporary Implants: none       Packing:                 Patient Condition: good       Findings (Including unexpected complications): Rumi uterine manipulator placed without issue after cervix gently sounded to 8cm and the external os was gently dilated to a 7 Hegar. Omega gel port  used at the umbilicus. Excision of multiple areas of endometriosis on the uterine surface. Specimens collected and sent to pathology. Approximately 5cm fundal fibroid removed without issue and without entering the endometrial cavity. Myometrium closed with running V-lock suture. Cystectomy of left sided endometrioma. Small physiology cyst removed from right ovary. Cystectomy of right sided endometrioma. Further excision of endometriosis performed on right lateral pelvic side wall. Specimens collected and sent to pathology. Copious irrigation and suction performed. Hemostasis noted throughout the pelvis. Abdomen desufflated. Fascia of umbilical port closed with running suture of 0-Vicryl. Skin re-approximated in the usual fashion. Foley draining clear yellow urine, bladder backfilled with 200cc and foley removed.     Signed:  Fayette Pho, MD  on 02/13/2015 at 12:49 PM

## 2015-02-13 NOTE — Progress Notes (Signed)
ACTUAL TIME OF TRANSPORT [phone].

## 2015-02-14 ENCOUNTER — Telehealth: Payer: Self-pay

## 2015-02-14 ENCOUNTER — Encounter: Payer: Self-pay | Admitting: Reproductive Endocrinology and Infertility

## 2015-02-14 NOTE — Telephone Encounter (Addendum)
Husband Richardson Landry called to ask if Lori Atkinson should continue with OCP.  Lori Atkinson also joined the conversation on speaker phone.   Dr Thurston Pounds advises that she complete the 21 active pills and await bleeding.  She will need to wait for the subsequent menses to resume treatment to allow for healing. She is doing fairly well 1 day postop.  Alternating 1 Percocet and Ibuprofen for pain control.  She has 12 Percocet left from a pre-existing prescription. She states that [facility] did not receive the Rx that was sent on 02/13/15.  Reassured that if she exhausts her supply we can send a new RX.  Will fax FMLA forms to [phone].  She also asks for a note to be sent to HR at her work c/o Charlann Boxer confirming date of surgery on 02/13/15 and projected return to work date of 02/28/15.

## 2015-02-15 LAB — SURGICAL PATHOLOGY

## 2015-02-26 ENCOUNTER — Other Ambulatory Visit: Payer: Self-pay

## 2015-02-26 ENCOUNTER — Ambulatory Visit: Payer: Self-pay | Admitting: Reproductive Endocrinology and Infertility

## 2015-02-26 DIAGNOSIS — N972 Female infertility of uterine origin: Secondary | ICD-10-CM

## 2015-02-26 DIAGNOSIS — Z9889 Other specified postprocedural states: Secondary | ICD-10-CM

## 2015-02-26 DIAGNOSIS — N809 Endometriosis, unspecified: Secondary | ICD-10-CM

## 2015-02-26 NOTE — Progress Notes (Signed)
Met with couple after her postop appt with Dr Thurston Pounds.  She just finished an OCP pack and is awaiting withdrawal flow.  In discussing timing for a proposed IVF with [facility] cycle it became evident they would be in [place] late March, so therefore this menses will not work to start the IVF cycle.  They were given an IVF packet and checklist of testing to be completed prior to start.  They were given OOT reqs to allow them to have ID testing done locally for which she will have better insurance coverage at [place]. She and her husband met with Lori Atkinson about financial aspects.  They will discuss their travel 3/18-04/14/15 and will get back in touch with office about whether or not she will plan to start with March or April menses.  Lori Atkinson was advised by Dr Thurston Pounds but she questions the necessity.  Dr Thurston Pounds plans to give her a call to further discuss.  Husband Lori Atkinson was given SA information sheet and cryo consent-undecided about freeze at present.  He plans to schedule SA prior to departure 3!8!7 for trip.  No zika cases contracted in [place] per pt's perusal of website.

## 2015-02-26 NOTE — Progress Notes (Signed)
REI FOLLOW-UP VISIT    Lori Atkinson is s/p a robotic assisted myomectomy and bilateral ovarian cystectomy and excision of endometriosis lesions.  She reports minimal pain.  Her appetite is appropriate and she is tolerating a regular diet.  She has had bowel movements and denies urinary symptoms.    We reviewed the pathology report which was normal.  Her questions were answered.     We reviewed her options for infertility management - she prefers to go to IVF rather than consider CC/IUI or FSH/IUI.    PHYSICAL EXAM  Last menstrual period 01/31/2015. There is no height or weight on file to calculate BMI.    Abd: soft, NT, incisions - C/D/I    ASSESSMENT  S/p robotic assisted myomectomy    PLAN  IVF   Financial counseling  RN exit to go through checklist    Trina Ao, MD

## 2015-02-26 NOTE — Patient Instructions (Signed)
Call to schedule semen analysis with [facility] staff.  (Mon-Thur)  Have ID labs done locally with req provided.  Call to schedule cycle day 2/3 labs and ultrasound (for [facility])                                       Also schedule [facility] locally                                    Notify office of time frame you are desiring based on planned travel.                                    Check zika website to be sure [place] not affected by [place].                     SALINE SONOHYSTEROGRAM  What is a saline sonohysterogram [facility])?  A saline sonohysterogram [facility]) consists of imaging the uterus and the uterine cavity using ultrasonography while sterile saline is instilled into the uterine cavity.  The purpose of the [facility] is to detect abnormalities of the uterus and endometrial (uterine) cavity.  How is the [facility] done?  Prior to the procedure, a baseline vaginal ultrasound will be performed.  Then after removing the probe, a speculum will be placed to visualize the cervix. The physician will place a small catheter into the uterus and the speculum removed.  The ultrasound probe will be replaced and sterile saline will be injected slowly into the uterine cavity.  This will slightly inflate the uterus so that most abnormalities (polyps or fibroids) can be visualized with ultrasound.  After the procedure, the saline will slowly drain out of the uterus, so you will need to wear a pad to protect your clothing.  You may experience some light spotting (bleeding) after the procedure.  What are the risks and complications of a [facility]?       Discomfort.  Some women may experience cramps for several hours.  We recommend taking ibuprofen (Motrin, Advil) one hour prior to the test to help minimize discomfort.     Infection.  Infection may happen, although this is rare, occurring less than 1% of the time and usually occurs in the presence of preexisting tubal disease.  Signs of an infection are lower abdominal pain and fever that develop  within a few days following the procedure.  You should contact the physician if you develop these signs.   Exposure of a potential pregnancy.    Despite your perception of a normal menstrual period, there is always the possibility of a potential pregnancy.  For this reason, a blood pregnancy test is required for all patients prior to the [facility].  Instructions prior to the [facility].   Please call our office at [phone] on day 1-3 of your menstrual cycle to schedule your [facility] between cycle days 6-12.  The [facility] is performed in our office, Monday through Friday.   A  blood pregnancy test is required and can be done from the onset of your menses to the day before your [facility].  If you are doing a [facility] and a treatment cycle in the same month, it is most convenient to have this done at the time  of your baseline ultrasound.  If you are not doing a treatment cycle, then you will need to go to a lab to have this bloodwork done.  When you schedule your [facility], a lab slip will be faxed to a lab of your choice to have your pregnancy test drawn prior to your procedure.  We will only call you if the pregnancy test is positive.   You may take Ibuprofen 600mg  approximately one hour prior to your [facility].    Saline sonohysterogram.doc Rev 2/12    [facility]  [id number]  [place], [place] [address]    SEMEN ANALYSIS  Some frequently asked questions     A semen analysis is an important initial step in any fertility evaluation. It determines the general quality of the female partners semen. The following are some commonly asked questions about the test that may be helpful to you prior to your semen analysis.     What does the semen analysis measure?    A comprehensive semen analysis measures:  1. Ejaculate volume: measured in milliliters.  This will help in the diagnosis of such disorders as retrograde ejaculation and ejaculatory duct obstruction.   2. Viscosity-thickness of sample  3.  Sperm count: the total number of  sperm in the whole ejaculate.  Total sperm count is calculated by multiplying the number of sperm per ml. (concentration) by the ejaculate volume.  4.  Motility: the percentage of sperm that are moving.  In any ejaculate, some sperm will be moving and some will not.  Sperm not moving will not participate in fertilization.  It is normal to have up to 50% of the specimen non-motile.  5.  Morphology: the appearance (size and shape) of the sperm.  Many sperm will have defects in the head, mid-piece, or tail.  If these defects are seen in high numbers (teratozoospermia), it can impair the swimming strength or ability of the sperm to penetrate and fertilize the egg.    6.  Number of white blood cells present in the sperm.  (*Any unusual contaminants such as bacteria or red blood cells would also be noted.)       How do I go about having a semen analysis?     You must first have a requisition form signed by the referring physician. The requisition can be provided by your primary care physician, urologist or your partners gynecologist. All analyses are done by appointment only. Please call the lab directly to make an appointment at [phone] option 3. Lab hours are Monday - Thursday 8:00am - 3:30pm.  When you call to schedule your appointment, the secretary will review instructions, and discuss insurance.     Does Insurance pay for a semen analysis?     Many policies do pay for semen analysis; it depends on your insurance provider.  Medicaid, Medicare, Guardian, Via Health, Blue Choice Option and Preferred Care Option do not cover this test. The test is $100.00 due at time of appointment if your insurance does not cover it. A receipt will be given at time of payment.        How should the semen be collected?     The semen should be collected into a sterile plastic container (normal urine container), which can be provided by the lab or your doctors office.  The specimen can be collected by masturbation, oral  stimulation or intercourse. Do not use lubricants while collecting the sample.  Where should the semen be collected?    The semen specimen can be collected at home, as long as it can be delivered to the lab within 1 hour after ejaculation. Your sample should be kept close to body temperature during transport.  If preferred, you may reserve a collection room, when you make your appointment, and collect your sample at our facility. The collection room contains magazines and movies to aid collection.      Is there a time of abstinence that is best for the test results?       It is recommended that a man have an ejaculation to flush his system, and then abstain for 2-4 days prior to collection of his specimen on his appointment date.  (It is not better to abstain for 1 week, with the intent to increase the sperm count. Prolonged abstinence will only increase dead sperm, debris and cells.)     Do the white blood cells in the semen mean there is an infection?     Not always, some white blood cells in semen are normal. Excessive white cells, consistent over repeated analyses may signal an infection. Your physician will discuss this with you and determine if treatment is appropriate.     What happens to the report of the semen analysis?     The semen analysis report is sent directly to the referring physician. The report will not be sent to the patient. All results and questions about results should be discussed directly with your doctor. They know your medical history and can discuss further testing and possible treatment regimens.       Please call the [facility] at [phone] to schedule your semen analysis.

## 2015-02-28 ENCOUNTER — Telehealth: Payer: Self-pay | Admitting: Reproductive Endocrinology and Infertility

## 2015-02-28 ENCOUNTER — Encounter: Payer: Self-pay | Admitting: Reproductive Endocrinology and Infertility

## 2015-02-28 NOTE — Telephone Encounter (Signed)
She had a HSG done at [facility] before starting the clomid cycle.  She will send [place] the images on CD to upload on to eRecord.

## 2015-03-03 ENCOUNTER — Other Ambulatory Visit: Payer: Self-pay | Admitting: Gastroenterology

## 2015-03-05 ENCOUNTER — Encounter: Payer: Self-pay | Admitting: Reproductive Endocrinology and Infertility

## 2015-03-14 ENCOUNTER — Encounter: Payer: Self-pay | Admitting: Reproductive Endocrinology and Infertility

## 2015-03-14 NOTE — Progress Notes (Signed)
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

## 2015-03-20 ENCOUNTER — Other Ambulatory Visit: Payer: Self-pay | Admitting: Reproductive Endocrinology and Infertility

## 2015-03-21 ENCOUNTER — Encounter: Payer: Self-pay | Admitting: Gastroenterology

## 2015-04-03 ENCOUNTER — Encounter: Payer: Self-pay | Admitting: Reproductive Endocrinology and Infertility

## 2015-04-20 ENCOUNTER — Encounter: Payer: Self-pay | Admitting: Reproductive Endocrinology and Infertility

## 2015-04-23 ENCOUNTER — Other Ambulatory Visit: Payer: Self-pay

## 2015-04-23 MED ORDER — NORETHIN-ETH ESTRAD TRIPHASIC 0.5/0.75/1-35 MG-MCG PO TABS *A*
1.0000 | ORAL_TABLET | Freq: Every day | ORAL | 2 refills | Status: DC
Start: 2015-04-23 — End: 2015-06-02

## 2015-04-23 MED ORDER — DESOGESTREL-ETHINYL ESTRADIOL 0.15-30 MG-MCG PO TABS *I*
ORAL_TABLET | ORAL | 1 refills | Status: DC
Start: 2015-04-23 — End: 2015-06-02

## 2015-04-23 NOTE — Telephone Encounter (Signed)
Pt prefers generic orthotricyclen-okay per Dr Thurston Pounds

## 2015-04-23 NOTE — Telephone Encounter (Signed)
Pt called to request a new Rx for Desogen as she has exhausted the refills.  Took last active pill on 04/21/15.  She expects menses on 4/12 or 04/26/15.  She will have bhcg, E2 and FSH locally and has reqs for those.  She will also schedule ultrasound with [facility].  She does wish to start IVF with this cycle.  She and her husband will both have their labs done locally this week.  Richardson Landry has scheduled the SA for the week of 04/30/15.

## 2015-04-26 ENCOUNTER — Telehealth: Payer: Self-pay

## 2015-04-26 NOTE — Telephone Encounter (Signed)
Pt called today to report LMP of 04/26/15.  She has scheduled baseline studies on 04/27/15 at 4:30 pm at [facility] and will expect a call on 04/28/15 about approval to start OC's for IVF cycle. She will be having ovarian reserve testing and ID labs on 04/27/15 as well.  Her husband Rainey Pines has SA and labs scheduled on 05/09/15.  Will discuss at 05/02/15 IVF meeting.

## 2015-04-27 ENCOUNTER — Encounter: Payer: Self-pay | Admitting: Gastroenterology

## 2015-04-28 ENCOUNTER — Telehealth: Payer: Self-pay

## 2015-04-28 NOTE — Telephone Encounter (Signed)
Called pt to tell her that our office has had difficulty receiving fax from [place]. They tried to fax the labs x3 from [place] and it did not come through.  Verbal report of bhcg <3, so okay to start OC's. We have not been able to get the ultrasound report either from late 04/27/15.  She states that it was being sent electronically but can not find it.  She is concerned because her left ovary has both a 4 cm cyst and another 2.5 cm structure thought to be a hemorrhagic cyst or endometrioma.  Will have to track down the reports on 04/30/15, but for now she should start OC's.

## 2015-04-29 ENCOUNTER — Other Ambulatory Visit: Payer: Self-pay | Admitting: Gastroenterology

## 2015-05-07 ENCOUNTER — Telehealth: Payer: Self-pay

## 2015-05-07 NOTE — Telephone Encounter (Signed)
Phone call to pt to follow up on ultrasound images that were sent to Dr Thurston Pounds from her studies on 04/27/15.  He told Probation officer to reassure the pt that he does not find the report worrisome, she can still move forward with IVF, the larger 4 cm cyst is a simple cyst and he thinks the 2.5 cm echogenic cyst is more likely to be a hemorrhagic cyst than an endometrioma.    Orders were placed for her husband Jeronimo Norma for ID testing on 05/09/15 to be done in advance of his SA at 13:30.   Informed pt that writer will follow up with [facility] regarding the Hepatitis B core antibody interpretation that is listed on her printed report.  It is reported as 2.78, but lists that as a negative result.  Will call seeking clarification and follow up with the pt by 05/09/15.

## 2015-05-09 ENCOUNTER — Telehealth: Payer: Self-pay

## 2015-05-09 NOTE — Telephone Encounter (Signed)
Phone call to pt after checking with [facility] in [facility] about the interpretation of her Hepatitis B core antibody.  For that one test their lab uses a revers algorhithm and therefore anything above 1.1 is considered negative, as it is stated on the written report.  Told her that writer did not have Steven's report form today's SA available yet.  Will ask Landry Dyke to call her on 05/10/15 regarding the calendar dates and appts needed.

## 2015-05-10 NOTE — Progress Notes (Signed)
T/C to pt to review dates for upcoming IVF cycle. Pt will plan to start med's 5/13, monitor the week of 5/15 and expected EGR the week of 5/22. RN will follow up with pt to review protocol and order med's with specialty pharmacy. Pt scheduled baseline & teaching appointments.

## 2015-05-11 ENCOUNTER — Other Ambulatory Visit: Payer: Self-pay

## 2015-05-11 MED ORDER — SYRINGE/NEEDLE (DISP) 22G X 1-1/2" 3 ML MISC *A*
1 refills | Status: DC
Start: 2015-05-11 — End: 2015-06-02

## 2015-05-11 MED ORDER — METHYLPREDNISOLONE 16 MG PO TABS *I*
ORAL_TABLET | ORAL | 1 refills | Status: DC
Start: 2015-05-11 — End: 2015-07-23

## 2015-05-11 MED ORDER — GANIRELIX ACETATE 250 MCG/0.5ML SC SOLN
SUBCUTANEOUS | 2 refills | Status: DC
Start: 2015-05-11 — End: 2015-07-23

## 2015-05-11 MED ORDER — NEEDLE (DISP) 27G X 1/2" MISC
1 refills | Status: DC
Start: 2015-05-11 — End: 2015-06-02

## 2015-05-11 MED ORDER — PROGESTERONE 50 MG/ML IM OIL *I*
TOPICAL_OIL | INTRAMUSCULAR | 4 refills | Status: DC
Start: 2015-05-11 — End: 2016-03-28

## 2015-05-11 MED ORDER — DOXYCYCLINE HYCLATE 100 MG PO CAPS *I*
ORAL_CAPSULE | ORAL | 1 refills | Status: DC
Start: 2015-05-11 — End: 2015-06-08

## 2015-05-11 MED ORDER — CHORIONIC GONADOTROPIN 10000 UNIT IM SOLR *I*
10000.0000 [IU] | Freq: Once | INTRAMUSCULAR | 1 refills | Status: AC
Start: 2015-05-11 — End: 2015-05-11

## 2015-05-11 MED ORDER — FOLLITROPIN ALFA 450 UNT/0.75ML SC SOLN
SUBCUTANEOUS | 3 refills | Status: DC
Start: 2015-05-11 — End: 2015-07-23

## 2015-05-11 MED ORDER — NEEDLE (DISP) 18G X 1-1/2" MISC *A*
2 refills | Status: DC
Start: 2015-05-11 — End: 2015-06-02

## 2015-05-11 MED ORDER — MENOTROPINS 75 UNIT SC SOLR *I*
SUBCUTANEOUS | 2 refills | Status: DC
Start: 2015-05-11 — End: 2015-07-23

## 2015-05-16 ENCOUNTER — Encounter: Payer: Self-pay | Admitting: Reproductive Endocrinology and Infertility

## 2015-05-16 ENCOUNTER — Other Ambulatory Visit: Payer: Self-pay

## 2015-05-17 MED ORDER — NORETHIN-ETH ESTRAD TRIPHASIC 0.5/0.75/1-35 MG-MCG PO TABS *A*
1.0000 | ORAL_TABLET | Freq: Every day | ORAL | 1 refills | Status: DC
Start: 2015-05-17 — End: 2015-06-02

## 2015-05-21 ENCOUNTER — Other Ambulatory Visit: Payer: Self-pay

## 2015-05-21 DIAGNOSIS — N979 Female infertility, unspecified: Secondary | ICD-10-CM

## 2015-05-22 ENCOUNTER — Ambulatory Visit: Payer: Self-pay | Admitting: Reproductive Endocrinology and Infertility

## 2015-05-22 ENCOUNTER — Other Ambulatory Visit: Payer: Self-pay

## 2015-05-22 ENCOUNTER — Ambulatory Visit: Payer: Self-pay

## 2015-05-22 ENCOUNTER — Encounter: Payer: Self-pay | Admitting: Reproductive Endocrinology and Infertility

## 2015-05-22 DIAGNOSIS — N979 Female infertility, unspecified: Secondary | ICD-10-CM

## 2015-05-22 LAB — CBC AND DIFFERENTIAL
Baso # K/uL: 0.1 10*3/uL (ref 0.0–0.1)
Basophil %: 0.6 %
Eos # K/uL: 0 10*3/uL (ref 0.0–0.4)
Eosinophil %: 0.2 %
Hematocrit: 41 % (ref 34–45)
Hemoglobin: 14.1 g/dL (ref 11.2–15.7)
IMM Granulocytes #: 0 10*3/uL (ref 0.0–0.1)
IMM Granulocytes: 0.3 %
Lymph # K/uL: 2.2 10*3/uL (ref 1.2–3.7)
Lymphocyte %: 25.8 %
MCH: 33 pg/cell — ABNORMAL HIGH (ref 26–32)
MCHC: 35 g/dL (ref 32–36)
MCV: 94 fL (ref 79–95)
Mono # K/uL: 0.4 10*3/uL (ref 0.2–0.9)
Monocyte %: 5.1 %
Neut # K/uL: 5.8 10*3/uL (ref 1.6–6.1)
Nucl RBC # K/uL: 0 10*3/uL (ref 0.0–0.0)
Nucl RBC %: 0 /100 WBC (ref 0.0–0.2)
Platelets: 160 10*3/uL (ref 160–370)
RBC: 4.3 MIL/uL (ref 3.9–5.2)
RDW: 13 % (ref 11.7–14.4)
Seg Neut %: 68 %
WBC: 8.6 10*3/uL (ref 4.0–10.0)

## 2015-05-22 LAB — REI ESTRADIOL (PERFORMED AT STRONG FERTILITY CLINIC): Estradiol,REI: 59 pg/mL

## 2015-05-22 LAB — REI PROGESTERONE (PERFORMED AT STRONG FERTILITY CLINIC): Progesterone,REI: 0.4 ng/mL

## 2015-05-22 NOTE — Preop H&P (Signed)
REI IVF H&P     HPI   Lori Atkinson is 40 y.o. G1P0010 with a history of infertility. She is seen today for a baseline physical examination, bloodwork, ultrasound assessment and review of protocol instructions prior to starting in vitro fertilization.      PAST MEDICAL HISTORY  No past medical history on file.     PAST SURGICAL HISTORY  Past Surgical History:   Procedure Laterality Date    FINGER SURGERY Left 2011    PR LAP,MYOMECTOMY 1-4,TOT WT 250 GMS N/A 02/13/2015    Procedure: Robotic  assisted laparoscopic  excision of endometriosis, removal of uterine fibroid, bilateral ovarian cystectomies;  Surgeon: Trina Ao, MD;  Location: [facility] MAIN OR;  Service: OBGYN       MEDICATIONS  Prior to Admission medications    Medication Sig Start Date End Date Taking? Authorizing Provider   norethindrone-ethinyl estradiol ([place] 7/7/7, NECON 7/7/7) 0.5/0.75/1-35 MG-MCG per tablet Take 1 tablet by mouth daily   Take one tab;et by mouth daily as directed, take only active pills 04/23/15  Yes Ward, Casimiro Needle, NP   cetirizine (ZYRTEC) 5 MG tablet Take 5 mg by mouth daily   Yes [provider]   norethindrone-ethinyl estradiol ([place] 7/7/7, NECON 7/7/7) 0.5/0.75/1-35 MG-MCG per tablet Take 1 tablet by mouth daily 05/17/15   Werzinger, Misty Stanley, PA   syringe-needle disposable (B-D 3CC LUER-LOK SYR 22GX1-1/2) 22G X 1-1/2" 3 ML Use daily as instructed. 05/11/15   Ward, Casimiro Needle, NP   doxycycline (VIBRAMYCIN) 100 MG capsule Sig one tablet by mouth twice daily as directed 05/11/15   Ward, Casimiro Needle, NP   Ganirelix Acetate 250 MCG/0.5ML SOLN Inject Subcutaneously once daily as directed. 05/11/15   Ward, Casimiro Needle, NP   Menotropins [facility]) 75 UNITS SOLR Dispense #10 05/11/15   Ward, Casimiro Needle, NP   NEEDLE, DISP, 27 G 27G X 1/2" MISC Use as directed 05/11/15   Ward, Casimiro Needle, NP   needle disposable (BD PRECISION GLIDE) 18G X 1-1/2" Dispense #20 05/11/15   Ward, Casimiro Needle, NP   progesterone 50 MG/ML injection Dispense 3 vials.  For IM  injection daily as directed 05/11/15   Ward, Casimiro Needle, NP   Follitropin Alfa (GONAL-F RFF PEN) 450 UNT/0.75ML SOLN Dispense 6 pens, for subcutaneous injection as directed 05/11/15   Ward, Casimiro Needle, NP   methylPREDNISolone (MEDROL) 16 MG tablet Sig one tablet by mouth daily as directed 05/11/15   Ward, Casimiro Needle, NP   desogestrel-ethinyl estradiol (APRI) 0.15-30 MG-MCG per tablet Sig one tablet by mouth daily as directed 04/23/15   Ward, Casimiro Needle, NP   ibuprofen (ADVIL,MOTRIN) 200 MG tablet Take 400 mg by mouth 3 times daily as needed for Pain    [provider]   norethindrone-ethinyl estradiol ([place] 7/7/7, NECON 7/7/7) 0.5/0.75/1-35 MG-MCG per tablet Take 1 tablet by mouth daily    [provider]   clomiPHENE (CLOMID) 50 MG tablet Take 2 tablets (100 mg total) by mouth daily 11/22/14   Trina Ao, MD        ALLERGIES  Allergies   Allergen Reactions    Sulfa Antibiotics Hives        FAMILY HISTORY  Family History   Problem Relation Age of Onset    Diabetes Father     Hypertension Father     Elevated lipids Father     Stroke Maternal Grandmother     Elevated lipids Mother           REVIEW OF SYSTEMS  General: negative   Respiratory: negative  Cardiovascular: negative   Gastrointestinal: negative   Genito-Urinary: negative   Musculoskeletal: negative   Endocrine: negative     PHYSICAL EXAM  Blood pressure 145/89, pulse 103, height 1.727 m (_0 ), weight 72.6 kg (160 lb), last menstrual period 04/26/2015. Body mass index is 24.33 kg/(m^2).  General: well appearing  HEENT:  no thyromegaly   Heart: S1S2 RRR  Lungs: CTA b/l  Abdomen: soft, NT, no CVAT  Extremities: No edema    Transvaginal ultrasound exam is performed and there is no evidence of ovarian cystic activity.  Both ovaries are accessible. Complete details of the ultrasound exam and findings are listed on the ultrasound report.     ASSESSMENT  Infertility for IVF  Bilateral simple cysts, 4 cm on right, 3 cm on left    PLAN    IVF MD  consent and oocyte aspiration were reviewed with the patient and signed.   After her questions were answered, these documents were signed. Blood work was sent for estradiol and a CBC.  Her medication instructions were reviewed. All of her questions were answered. She will met with one of the IVF nurse coordinators to review her treatment protocol.      The duration of today's visit was 25 minutes, the majority of which was dedicated to counseling and coordination of care.     Dickie La, MD

## 2015-05-22 NOTE — Progress Notes (Signed)
IVF Calendar reviewed.    Gonal-F, Menopur , Ganirelix , hCG and Progesterone reviewed.    Potential side effects and risks of medications reviewed. Goals of therapy and potential for cancellation discussed.  She had not spoken with [facility] yet to confirm if the Gonal-F is getting shipped today.  She will notify the office by 05/23/15 at the latest if meds need to be reordered at [facility] to allow for coverage.     Patient and Husband gave injection of sterile saline IM and SC using sterile technique. Both of them did a satisfactory job with practicing the injections.     Egg retrieval and embryo transfer procedures reviewed.  She was given the forms that need to be notarized and was given the option of signing with Ria Bush at the office if that would be more convenient.  Otherwise she can return the forms at her 05/29/15 appt.     Patient and Husband verbalized understanding of their IVF calendar and medication injection technique/administration.     Patient will take last OCP on 05/22/15 and schedule BL u/s H & P visit for 05/22/15. Anticipate starting gonadotropins on 05/26/15 for Antagonist protocol.

## 2015-05-22 NOTE — Progress Notes (Signed)
Met with couple post BL H&P, Consents signed and notarized, and couple will be doing PGS, HIPPA and Embryo Consents signed.  Patient has a cyst, and I reviewed office will contact her tomorrow with blood work results and plan for cyst follow up.  Patient does not have medication, she states Pharmacy will not send without prior authorization, I will follow up on.

## 2015-05-23 ENCOUNTER — Encounter: Payer: Self-pay | Admitting: Reproductive Endocrinology and Infertility

## 2015-05-24 NOTE — Procedures (Signed)
Uterine Sounding/Trial Transfer    Uterine position: anteverted    Sounded to: 8 cm    Tenaculum used: No    Wallace catheter was passed without advancing sheath    Recommend stitch for transfer: No    Other comments: easy    Dickie La, MD

## 2015-05-28 ENCOUNTER — Other Ambulatory Visit: Payer: Self-pay

## 2015-05-28 MED ORDER — FOLLITROPIN ALFA 900 UNIT/1.5ML SC SOLN
SUBCUTANEOUS | 2 refills | Status: DC
Start: 2015-05-28 — End: 2015-07-23

## 2015-05-29 ENCOUNTER — Encounter: Payer: Self-pay | Admitting: Reproductive Endocrinology and Infertility

## 2015-05-29 ENCOUNTER — Telehealth: Payer: Self-pay

## 2015-05-29 ENCOUNTER — Ambulatory Visit: Payer: Self-pay | Admitting: Reproductive Endocrinology and Infertility

## 2015-05-29 DIAGNOSIS — N979 Female infertility, unspecified: Secondary | ICD-10-CM

## 2015-05-29 LAB — REI ESTRADIOL (PERFORMED AT STRONG FERTILITY CLINIC): Estradiol,REI: 298 pg/mL

## 2015-05-29 NOTE — Progress Notes (Signed)
Pt was seen for a midcycle u/s evaluation today, await labs for further instructions.

## 2015-05-29 NOTE — Telephone Encounter (Signed)
Spoke to pt, reviewed results and plan. Pt to continue taking Gonal F 300 tonight 5/16 and tomorrow 5/17. RTC for scheduled blood work and ultrasound on 5/18. Pt understands.

## 2015-05-31 ENCOUNTER — Telehealth: Payer: Self-pay

## 2015-05-31 ENCOUNTER — Encounter: Payer: Self-pay | Admitting: Reproductive Endocrinology and Infertility

## 2015-05-31 ENCOUNTER — Ambulatory Visit: Payer: Self-pay | Admitting: Reproductive Endocrinology and Infertility

## 2015-05-31 DIAGNOSIS — N979 Female infertility, unspecified: Secondary | ICD-10-CM

## 2015-05-31 LAB — REI ESTRADIOL (PERFORMED AT STRONG FERTILITY CLINIC): Estradiol,REI: 479 pg/mL

## 2015-05-31 NOTE — Telephone Encounter (Signed)
E 2 = 479 today. T/C to pt with results and plan , pt to cont Gonal f 300  tonight and 5/19. Pt to schedule u/s and blood work on 5/20.

## 2015-05-31 NOTE — Progress Notes (Signed)
Pt was seen for a midcycle u/s evaluation today, await labs for further instructions.

## 2015-06-02 ENCOUNTER — Encounter: Payer: Self-pay | Admitting: Reproductive Endocrinology and Infertility

## 2015-06-02 ENCOUNTER — Telehealth: Payer: Self-pay

## 2015-06-02 ENCOUNTER — Ambulatory Visit: Payer: Self-pay | Admitting: Reproductive Endocrinology and Infertility

## 2015-06-02 DIAGNOSIS — N979 Female infertility, unspecified: Secondary | ICD-10-CM

## 2015-06-02 NOTE — Progress Notes (Signed)
Pelvic sonogram for follicular monitoring performed.

## 2015-06-02 NOTE — Telephone Encounter (Signed)
Patient instructed to continue on gonal f 300 units, start ganirelix and menopur 150 and RTC 06/04/15

## 2015-06-04 ENCOUNTER — Encounter: Payer: Self-pay | Admitting: Reproductive Endocrinology and Infertility

## 2015-06-04 ENCOUNTER — Ambulatory Visit: Payer: Self-pay | Admitting: Reproductive Endocrinology and Infertility

## 2015-06-04 ENCOUNTER — Telehealth: Payer: Self-pay | Admitting: Reproductive Endocrinology and Infertility

## 2015-06-04 DIAGNOSIS — N979 Female infertility, unspecified: Secondary | ICD-10-CM

## 2015-06-04 LAB — REI ESTRADIOL (PERFORMED AT STRONG FERTILITY CLINIC)
Estradiol,REI: 534 pg/mL
Estradiol,REI: 1007 pg/mL

## 2015-06-04 LAB — REI PROGESTERONE (PERFORMED AT STRONG FERTILITY CLINIC)
Progesterone,REI: 1.2 ng/mL
Progesterone,REI: 1.7 ng/mL

## 2015-06-04 NOTE — Telephone Encounter (Signed)
TC to pt, stay on same doses, GF 300, Menopur 150, Ganirelix for 2 more nights and RTC 5/24 for next assessment.

## 2015-06-04 NOTE — Progress Notes (Signed)
Pt was seen for a midcycle u/s evaluation today, await labs for further instructions.

## 2015-06-05 ENCOUNTER — Encounter: Payer: Self-pay | Admitting: Gastroenterology

## 2015-06-05 ENCOUNTER — Encounter: Payer: Self-pay | Admitting: Reproductive Endocrinology and Infertility

## 2015-06-06 ENCOUNTER — Ambulatory Visit: Payer: Self-pay | Admitting: Reproductive Endocrinology and Infertility

## 2015-06-06 ENCOUNTER — Encounter: Payer: Self-pay | Admitting: Reproductive Endocrinology and Infertility

## 2015-06-06 ENCOUNTER — Other Ambulatory Visit: Payer: Self-pay | Admitting: Reproductive Endocrinology and Infertility

## 2015-06-06 DIAGNOSIS — N979 Female infertility, unspecified: Secondary | ICD-10-CM

## 2015-06-06 LAB — REI ESTRADIOL (PERFORMED AT STRONG FERTILITY CLINIC): Estradiol,REI: 1590 pg/mL

## 2015-06-06 LAB — REI PROGESTERONE (PERFORMED AT STRONG FERTILITY CLINIC): Progesterone,REI: 2 ng/mL

## 2015-06-06 MED ORDER — ACETAMINOPHEN 325 MG PO TABS *I*
325.0000 mg | ORAL_TABLET | ORAL | Status: AC | PRN
Start: 2015-06-08 — End: 2015-06-08
  Administered 2015-06-08: 325 mg via ORAL

## 2015-06-06 MED ORDER — LACTATED RINGERS IV SOLN *I*
75.0000 mL/h | INTRAVENOUS | Status: AC
Start: 2015-06-08 — End: 2015-06-08
  Administered 2015-06-08: 75 mL/h via INTRAVENOUS

## 2015-06-06 MED ORDER — CEFOXITIN SODIUM 1 GM IV SOLR *I*
1000.0000 mg | Freq: Four times a day (QID) | INTRAVENOUS | Status: AC
Start: 2015-06-08 — End: 2015-06-08
  Administered 2015-06-08: 1000 mg via INTRAVENOUS

## 2015-06-06 NOTE — Progress Notes (Signed)
Pt was seen for a midcycle u/s evaluation today, await labs for further instructions.  Mixed hCG for pt today, discussed probable hCG tonight.

## 2015-06-07 ENCOUNTER — Telehealth: Payer: Self-pay | Admitting: Reproductive Endocrinology and Infertility

## 2015-06-07 ENCOUNTER — Encounter: Payer: Self-pay | Admitting: Reproductive Endocrinology and Infertility

## 2015-06-07 ENCOUNTER — Encounter: Payer: Self-pay | Admitting: Gastroenterology

## 2015-06-07 NOTE — Telephone Encounter (Signed)
Answered Lori Atkinson's questions about freeze all and PGS.  She wants to have PGS and this works to her advantage.

## 2015-06-07 NOTE — Telephone Encounter (Signed)
TC to pt, QhCG = 179, no changes necessary RTC in am for ER.

## 2015-06-08 ENCOUNTER — Ambulatory Visit: Payer: Self-pay | Admitting: Reproductive Endocrinology and Infertility

## 2015-06-08 ENCOUNTER — Other Ambulatory Visit: Payer: Self-pay

## 2015-06-08 VITALS — BP 108/71 | HR 94 | Temp 98.6°F | Resp 18 | Ht 68.0 in | Wt 164.0 lb

## 2015-06-08 DIAGNOSIS — N979 Female infertility, unspecified: Secondary | ICD-10-CM

## 2015-06-08 MED ORDER — DOXYCYCLINE HYCLATE 100 MG PO TABS *I*
ORAL_TABLET | ORAL | 1 refills | Status: DC
Start: 2015-06-08 — End: 2015-11-14

## 2015-06-08 MED ORDER — DESOGESTREL-ETHINYL ESTRADIOL 0.15-30 MG-MCG PO TABS *I*
ORAL_TABLET | ORAL | 2 refills | Status: DC
Start: 2015-06-08 — End: 2015-08-17

## 2015-06-08 NOTE — Patient Instructions (Signed)
After Egg Retrieval    You Should:    1. Expect some blood-tinged drainage for 72 hours, mild cramps or mild nausea.  2. Rest at home the remainder of the day.  3. Use Tylenol, 1-2 tablets every 4-6 hours if needed for cramps; you can aspirin, Nuprin or Advil or equivalent medications.  4. Expect to bathe or shower.  5. The lab will call you in the morning with fertilization results. Your next step is to call with the start of your period, if this occurs on a Friday evening call Saturday am, Sat/Sun call Monday am.       You Should NOT:    1. Be alone for 8-10 hours.  Someone should be with you to make a phone call or bring you back to the hospital if necessary.  2. Have severe nausea, heavy bleeding, severe pain or major problems with    dizziness or headache.  3. Exercise strenuously.  4. Have intercourse or use tampons until further notice.        If you have additional questions, or if problems arise, please call the nursing staff at [phone].  After business hours or on weekends you will reach our after hours answering service.  Please leave a message with the representative and your call will be returned in the morning.  If you have an emergency, have the representative page the reproductive endocrinology doctor on call.                                                                                                                                                                   2/12 [facility]

## 2015-06-08 NOTE — Procedures (Signed)
PREOPERATIVE DIAGNOSIS:   Multiple ovarian follicular cysts.      POSTOPERATIVE DIAGNOSIS:  Multiple ovarian follicular cysts.      OPERATIVE PROCEDURE:      Transvaginal ultrasound guided follicular cyst   aspiration.      ANESTHESIA: Moderate sedation.      INDICATIONS FOR PROCEDURE:  Infertility.      DESCRIPTION OF PROCEDURE:  After having received proper and informed   consent, the patient was brought back to the procedure room.  She had a   chance to spontaneously empty her bladder and therefore, catheterization   was not necessary.  She received preoperative antibiotics and in the   procedure room, she had IV administration of propofol to accomplish deep   sedation.  She was then placed in dorsal lithotomy, prepped, and draped in   the usual sterile fashion.      The vaginal probe ultrasound was inserted and the pelvic structures were   imaged.  Both ovaries were multicystic and noted to be accessible.  A 17   gauge single lumen needle was used for aspiration.  Aspiration commenced   with the right ovary.  Each follicular cyst was aspirated under direct   visualization.  Several rinse cycles with sterile culture media were   utilized.  When all follicles had been obtained from the right ovary, the   same process was repeated for the left.  All fluid obtained was submitted   to the Embryology Lab for analysis at the end of the case.  They notified   us that we had recovered 8 oocytes.  The ultrasound probe was used to   inspect the pelvis.  There was a large hypoechoic cycst on the right that was drained of 90 cc of straw colored fluid. Another cyst, seen on the left was hyperechoic and was not drained.  At this point, the speculum was replaced and the cervix and   vagina were noted to be hemostatic.  The patient was then returned to the   recovery room in stable condition.

## 2015-06-08 NOTE — Progress Notes (Signed)
Egg Retrieval Discharge    Patient did well with procedure. Patient was able to tolerate PO, ambulate to bathroom and void. Pt is a freeze all. Patient discharged to home with spouse in stable condition.

## 2015-06-09 ENCOUNTER — Telehealth: Payer: Self-pay

## 2015-06-09 NOTE — Telephone Encounter (Signed)
Patient is feeling well after ER. Minimal cramping and spotting noted.

## 2015-06-13 ENCOUNTER — Encounter: Payer: Self-pay | Admitting: Gastroenterology

## 2015-06-18 ENCOUNTER — Encounter: Payer: Self-pay | Admitting: Reproductive Endocrinology and Infertility

## 2015-06-19 ENCOUNTER — Telehealth: Payer: Self-pay

## 2015-06-19 ENCOUNTER — Encounter: Payer: Self-pay | Admitting: Gastroenterology

## 2015-06-19 NOTE — Telephone Encounter (Signed)
Spoke to pt, reviewed result: Bhcg negative today. Let pt know we will follow up tomorrow after confirming plan with Dr. B to start OCP as there is no plan in place at this point and no follow up has been scheduled (PGS results from IVF cycle not available as of today).

## 2015-06-20 ENCOUNTER — Telehealth: Payer: Self-pay | Admitting: Reproductive Endocrinology and Infertility

## 2015-06-20 NOTE — Telephone Encounter (Signed)
Gave her the results of the PGS - 5 biopsied, 2 normal (1 XX and 1 XY divulged at her request) and 3 aneuploid.  She is considering the option of doing another IVF cycle vs going ahead with transfer.  Her hCG was negative yesterday and today is CD3. She will start OCP and call us with her decision once she speaks with Richardson Landry, her partner.

## 2015-06-25 ENCOUNTER — Encounter: Payer: Self-pay | Admitting: Reproductive Endocrinology and Infertility

## 2015-06-25 ENCOUNTER — Telehealth: Payer: Self-pay

## 2015-06-25 NOTE — Telephone Encounter (Signed)
Pt called after receiving my chart response. Has some scheduling challenges with her pt scheduled that will make it difficult to take an appt on short notice once Dr Gilles Chiquito schedule for retrievals the week of 07/23/15 is determined.  Dr Thurston Pounds suggested that we schedule her for 11:00am on 07/23/15.  She is currently taking OC's, 6/7-6/27 with this pack, then she will take one week off and restart.  She confirms that she she does want to do another fresh cycle with PGS for embryo accumulation.

## 2015-07-12 ENCOUNTER — Other Ambulatory Visit: Payer: Self-pay

## 2015-07-12 ENCOUNTER — Encounter: Payer: Self-pay | Admitting: Reproductive Endocrinology and Infertility

## 2015-07-12 MED ORDER — NORGESTIM-ETH ESTRAD TRIPHASIC 0.18/0.215/0.25 MG-35 MCG PO TABS *I*
ORAL_TABLET | ORAL | 1 refills | Status: DC
Start: 2015-07-12 — End: 2015-08-17

## 2015-07-18 ENCOUNTER — Telehealth: Payer: Self-pay

## 2015-07-18 NOTE — Telephone Encounter (Signed)
Pt called back to say that she had not missed an pills in last OCP pack but mentions that the bleeding seemed lighter than normal, very scant.  Reassured that lighter flow d/t thinner endometrium on OC's is common.  Okay to start OC's today and she will be in for a follow up appt on 07/23/15 with Dr Thurston Pounds.

## 2015-07-23 ENCOUNTER — Ambulatory Visit: Payer: Self-pay | Admitting: Reproductive Endocrinology and Infertility

## 2015-07-23 ENCOUNTER — Encounter: Payer: Self-pay | Admitting: Reproductive Endocrinology and Infertility

## 2015-07-23 ENCOUNTER — Telehealth: Payer: Self-pay

## 2015-07-23 VITALS — BP 118/82 | HR 78 | Resp 16 | Ht 68.0 in | Wt 164.0 lb

## 2015-07-23 DIAGNOSIS — N979 Female infertility, unspecified: Secondary | ICD-10-CM

## 2015-07-23 LAB — CBC
Hematocrit: 38 % (ref 34–45)
Hemoglobin: 13.2 g/dL (ref 11.2–15.7)
MCH: 33 pg/cell — ABNORMAL HIGH (ref 26–32)
MCHC: 35 g/dL (ref 32–36)
MCV: 94 fL (ref 79–95)
Platelets: 265 10*3/uL (ref 160–370)
RBC: 4 MIL/uL (ref 3.9–5.2)
RDW: 12.2 % (ref 11.7–14.4)
WBC: 7.4 10*3/uL (ref 4.0–10.0)

## 2015-07-23 NOTE — Progress Notes (Signed)
REI FOLLOW-UP VISIT    Lori Atkinson is a 40 y.o. G1P0010 who presents for follow up    We discussed:  Pain - present daily but is very tolerable and she does not want anything to be done     IVF cycle - reviewed previous cycle; With AMH of 4.6 in 5/16, she had a antagonist protocol with a 300 FSH start which increased to 450 after starting ganirelix; 8 egg retrieved, 8 mature and ICSI, 7 fert, 5 biopsied, 2 normal at [facility]    Proposed plan for next cycle: MDF or Antagonist with 450 [place], DHEA, CoQ10 (declined [place] and ANdroderm); Freeze all with PGS again.      Christian's medications, allergies, and past medical, surgical, family and social history reviewed and updated.      REVIEW OF SYSTEMS  Negative except for HPI    PHYSICAL EXAM  Blood pressure 118/82, pulse 78, resp. rate 16, height 1.727 m (5\' 8" ), weight 74.4 kg (164 lb), last menstrual period 07/16/2015. Body mass index is 24.94 kg/(m^2).      ASSESSMENT  40 yo with endometriosis and infertility    PLAN  AMH today  [facility] in the next couple of weeks  Discuss dates with her today  Present plan at IVF meeting on Wednesday      The duration of today's visit was 30 minutes, the majority of which was dedicated to counseling and coordination of care.     Trina Ao, MD

## 2015-07-23 NOTE — Progress Notes (Addendum)
Patient here for f/u appointment. Plan is for another fresh IVF cycle for embryo accumulation.  She is on active OCP's. Patient will schedule an [facility] locally at [facility]. Requisition given.  Reviewed tentative plan for microdose lupron, Co Q10 and DHEA.  Lori Atkinson will call patient to discuss dates.  She does not believe she has any medication remaining.  AMH drawn today.

## 2015-07-23 NOTE — Patient Instructions (Signed)
Lori Atkinson will call you to review scheduling of your IVF cycle.  Tentative plan is for a microdose lupron cycle with addition of DHEA and Co Q10.  Plan will be for a freezing of all embryos.

## 2015-07-23 NOTE — Telephone Encounter (Signed)
Spoke with pt to review dates for upcoming IVF cycle. Plan will be discussed and confirmed in 7/12 IVF meeting. Pt would like to start week of 7/24, monitor the week of 7/31 and expected retrieval week of 8/7. RN will follow up with pt after meeting to review protocol and order med's with specialty pharmacy.

## 2015-07-25 ENCOUNTER — Encounter: Payer: Self-pay | Admitting: Reproductive Endocrinology and Infertility

## 2015-07-26 ENCOUNTER — Telehealth: Payer: Self-pay

## 2015-07-26 ENCOUNTER — Other Ambulatory Visit: Payer: Self-pay

## 2015-07-26 LAB — ANTIMULLERIAN HORMONE (AMH): Anti Mullerian Hormone: 0.743 ng/mL (ref 0.176–11.705)

## 2015-07-26 MED ORDER — FOLLITROPIN ALFA 450 UNIT IJ SOLR *I*
INTRAMUSCULAR | 2 refills | Status: DC
Start: 2015-07-26 — End: 2016-03-28

## 2015-07-26 MED ORDER — CHORIONIC GONADOTROPIN 10000 UNIT IM SOLR *I*
INTRAMUSCULAR | 1 refills | Status: DC
Start: 2015-07-26 — End: 2016-03-28

## 2015-07-26 MED ORDER — LEUPROLIDE ACETATE 3.75 MG IM KIT *I*
40.0000 ug | PACK | Freq: Two times a day (BID) | INTRAMUSCULAR | 2 refills | Status: DC
Start: 2015-07-26 — End: 2016-03-28

## 2015-07-26 NOTE — Telephone Encounter (Signed)
T/C to pt to review plan for IVF cycle. Pt is to start on DHEA 75 mg and Co Q 10 200 mg three times a day. Has baseline apt scheduled for 7/25. Plan is to start microdose lupron on 7/26 and plan for [facility] start on 7/28 and ER the week of 8/7. Med's for cycle sent to [facility]. Pt to have microdose Lupron shipped on 7/22.Will mail IVF plan.

## 2015-08-02 ENCOUNTER — Other Ambulatory Visit: Payer: Self-pay | Admitting: Gastroenterology

## 2015-08-02 ENCOUNTER — Encounter: Payer: Self-pay | Admitting: Reproductive Endocrinology and Infertility

## 2015-08-03 NOTE — Progress Notes (Signed)
Dr Thurston Pounds received written report on [facility] done at [facility] by Dr Nicki Reaper on 08/02/15.  Essentially normal report, known fibroids and endometriosis noted.

## 2015-08-06 ENCOUNTER — Other Ambulatory Visit: Payer: Self-pay

## 2015-08-06 DIAGNOSIS — N979 Female infertility, unspecified: Secondary | ICD-10-CM

## 2015-08-07 ENCOUNTER — Encounter: Payer: Self-pay | Admitting: Reproductive Endocrinology and Infertility

## 2015-08-07 ENCOUNTER — Telehealth: Payer: Self-pay | Admitting: Reproductive Endocrinology and Infertility

## 2015-08-07 ENCOUNTER — Ambulatory Visit: Payer: Self-pay | Admitting: Reproductive Endocrinology and Infertility

## 2015-08-07 DIAGNOSIS — N979 Female infertility, unspecified: Secondary | ICD-10-CM

## 2015-08-07 LAB — CBC
Hematocrit: 40 % (ref 34–45)
Hemoglobin: 14.2 g/dL (ref 11.2–15.7)
MCH: 33 pg/cell — ABNORMAL HIGH (ref 26–32)
MCHC: 36 g/dL (ref 32–36)
MCV: 92 fL (ref 79–95)
Platelets: 167 10*3/uL (ref 160–370)
RBC: 4.3 MIL/uL (ref 3.9–5.2)
RDW: 12.6 % (ref 11.7–14.4)
WBC: 6.6 10*3/uL (ref 4.0–10.0)

## 2015-08-07 LAB — REI PROGESTERONE (PERFORMED AT STRONG FERTILITY CLINIC): Progesterone,REI: 0.3 ng/mL

## 2015-08-07 LAB — REI ESTRADIOL (PERFORMED AT STRONG FERTILITY CLINIC): Estradiol,REI: 41 pg/mL

## 2015-08-07 NOTE — H&P (Signed)
REI IVF H&P     HPI   Lori Atkinson is 40 y.o. G1P0010 with a history of  infertility. She is seen today for a baseline physical examination, bloodwork, ultrasound assessment and review of protocol instructions prior to starting in vitro fertilization.      PAST MEDICAL HISTORY  No past medical history on file.     PAST SURGICAL HISTORY  Past Surgical History:   Procedure Laterality Date    FINGER SURGERY Left 2011    PR LAP,MYOMECTOMY 1-4,TOT WT 250 GMS N/A 02/13/2015    Procedure: Robotic  assisted laparoscopic  excision of endometriosis, removal of uterine fibroid, bilateral ovarian cystectomies;  Surgeon: Trina Ao, MD;  Location: [facility] MAIN OR;  Service: OBGYN       MEDICATIONS  Prior to Admission medications    Medication Sig Start Date End Date Taking? Authorizing Provider   cetirizine (ZYRTEC) 5 MG tablet Take 5 mg by mouth daily   Yes [provider]   leuprolide (LUPRON) microdose injection 40 mcg/0.1 mL Inject 40 mcg into the skin 2 times daily   Take sc twice daily as directed 07/26/15   Ayven Glasco, Casimiro Needle, NP   Follitropin Alfa 450 UNITS SOLR Inject 450 sc daily as instructed. Dispense 12 pens 07/26/15   Euclide Granito, Casimiro Needle, NP   chorionic gonadotropin (PREGNYL, NOVAREL) 10000 UNITS injection Take IM x 1 as directed 07/26/15   Azavier Creson, Casimiro Needle, NP   Norgestim-Eth Radene Journey Triphasic (ORTHO TRI-CYCLEN, 28,) 0.18/0.215/0.25 MG-35 MCG TABS Sig one active tablet by mouth daily as directed 07/12/15   Hadasa Gasner, Casimiro Needle, NP   desogestrel-ethinyl estradiol (APRI) 0.15-30 MG-MCG per tablet Take 1 tablet daily, active pills only. Only start when instructed. 06/08/15   Arturo Sofranko, Casimiro Needle, NP   doxycycline (VIBRA-TABS) 100 MG tablet Take 1 tablet twice daily x 14 days 06/08/15   Shardea Cwynar, Casimiro Needle, NP   progesterone 50 MG/ML injection Dispense 3 vials.  For IM injection daily as directed 05/11/15   Darriona Dehaas, Casimiro Needle, NP        ALLERGIES  Allergies   Allergen Reactions    Sulfa Antibiotics Hives        FAMILY HISTORY  Family History   Problem  Relation Age of Onset    Diabetes Father     Hypertension Father     Elevated lipids Father     Stroke Maternal Grandmother     Elevated lipids Mother           REVIEW OF SYSTEMS  General: negative   Respiratory: negative   Cardiovascular: negative   Gastrointestinal: negative   Genito-Urinary: negative   Musculoskeletal: negative   Endocrine: negative     PHYSICAL EXAM  Blood pressure 140/83, pulse 82, resp. rate 16, height 1.727 m (5\' 8" ), weight 74.4 kg (164 lb), last menstrual period 07/16/2015. Body mass index is 24.94 kg/(m^2).  General: well appearing  HEENT:  no thyromegaly   Heart: RRR  Lungs: CTA b/l  Abdomen: soft, NT, ND  Extremities: No edema    Transvaginal ultrasound exam is performed and there is evidence of cystic activity on her left ovary Both ovaries are accessible. Complete details of the ultrasound exam and findings are listed on the ultrasound report.     ASSESSMENT  Infertility for IVF    PLAN    Consent for In Vitro Fertilization Procedure was reviewed.  After her questions were answered, this document was signed.  The IVF Treatment Plan was reviewed and signed by the patient.  Blood work was sent  for estradiol, progesterone and a CBC.  Her medication instructions were reviewed. She is expecting her micro dose lupron to be delivered tomorrow.  She will get 2 doses in tomorros (spaced out as far as she can) and will get on a normal bid schedule for lupron on Thursday. All of her questions were answered.       The duration of today's visit was 25 minutes, the majority of which was dedicated to counseling and coordination of care.

## 2015-08-07 NOTE — Telephone Encounter (Signed)
LM for pt, ok to start cycle as planned, start microdose lupron tomorrow and [place] on Friday, RTC 8/1 for reassessment.

## 2015-08-08 ENCOUNTER — Encounter: Payer: Self-pay | Admitting: Reproductive Endocrinology and Infertility

## 2015-08-09 ENCOUNTER — Other Ambulatory Visit: Payer: Self-pay

## 2015-08-09 DIAGNOSIS — N979 Female infertility, unspecified: Secondary | ICD-10-CM

## 2015-08-09 MED ORDER — INSULIN SYRINGE-NEEDLE U-100 30G X 1/2" 0.5 ML MISC *A*
3 refills | Status: DC
Start: 2015-08-09 — End: 2015-08-17

## 2015-08-14 ENCOUNTER — Encounter: Payer: Self-pay | Admitting: Reproductive Endocrinology and Infertility

## 2015-08-14 ENCOUNTER — Telehealth: Payer: Self-pay

## 2015-08-14 ENCOUNTER — Ambulatory Visit: Payer: Self-pay | Admitting: Reproductive Endocrinology and Infertility

## 2015-08-14 DIAGNOSIS — N979 Female infertility, unspecified: Secondary | ICD-10-CM

## 2015-08-14 NOTE — Progress Notes (Signed)
Pt was seen for a midcycle u/s evaluation today, await labs for further instructions.

## 2015-08-14 NOTE — Telephone Encounter (Signed)
Spoke to pt. Estrogen 1406. Plan to decrease gonal F to 375 8/1 & 8/2; continue all other meds as previously instructed.

## 2015-08-15 LAB — REI ESTRADIOL (PERFORMED AT STRONG FERTILITY CLINIC): Estradiol,REI: 1406 pg/mL

## 2015-08-15 LAB — REI PROGESTERONE (PERFORMED AT STRONG FERTILITY CLINIC): Progesterone,REI: 0.5 ng/mL

## 2015-08-16 ENCOUNTER — Encounter: Payer: Self-pay | Admitting: Reproductive Endocrinology and Infertility

## 2015-08-16 ENCOUNTER — Ambulatory Visit: Payer: Self-pay | Admitting: Reproductive Endocrinology and Infertility

## 2015-08-16 ENCOUNTER — Telehealth: Payer: Self-pay | Admitting: Reproductive Endocrinology and Infertility

## 2015-08-16 DIAGNOSIS — N979 Female infertility, unspecified: Secondary | ICD-10-CM

## 2015-08-16 LAB — REI PROGESTERONE (PERFORMED AT STRONG FERTILITY CLINIC): Progesterone,REI: 0.8 ng/mL

## 2015-08-16 LAB — REI ESTRADIOL (PERFORMED AT STRONG FERTILITY CLINIC): Estradiol,REI: 2318 pg/mL

## 2015-08-16 NOTE — Telephone Encounter (Signed)
TC to pt, E2= 2318.  Continue GF 375, micro lupron, RTC tomorrow for monitoring due to estrogen level rising quickly.

## 2015-08-16 NOTE — Progress Notes (Signed)
Pt was seen for a midcycle u/s evaluation today, await labs for further instructions. Pt complains of lower back pain, most likely related to enlarging ovaries.  Can try tylenol and heating pad.

## 2015-08-17 ENCOUNTER — Encounter: Payer: Self-pay | Admitting: Reproductive Endocrinology and Infertility

## 2015-08-17 ENCOUNTER — Ambulatory Visit: Payer: Self-pay | Admitting: Reproductive Endocrinology and Infertility

## 2015-08-17 ENCOUNTER — Telehealth: Payer: Self-pay

## 2015-08-17 DIAGNOSIS — N979 Female infertility, unspecified: Secondary | ICD-10-CM

## 2015-08-17 NOTE — Telephone Encounter (Signed)
Spoke to pt, reviewed results and plan. Pt to continue lupron BID and decrease Gonal F to 300, continue Coq10 and dhea today 8/4. Pt will RTC for scheduled blood work and ultrasound appointment 8/5.

## 2015-08-17 NOTE — Progress Notes (Signed)
Midcycle for IVF cycle.

## 2015-08-18 ENCOUNTER — Encounter: Payer: Self-pay | Admitting: Reproductive Endocrinology and Infertility

## 2015-08-18 ENCOUNTER — Ambulatory Visit: Payer: Self-pay | Admitting: Reproductive Endocrinology and Infertility

## 2015-08-18 ENCOUNTER — Telehealth: Payer: Self-pay

## 2015-08-18 DIAGNOSIS — N979 Female infertility, unspecified: Secondary | ICD-10-CM

## 2015-08-18 LAB — REI PROGESTERONE (PERFORMED AT STRONG FERTILITY CLINIC): Progesterone,REI: 1.4 ng/mL

## 2015-08-18 LAB — REI ESTRADIOL (PERFORMED AT STRONG FERTILITY CLINIC): Estradiol,REI: 2436 pg/mL

## 2015-08-18 NOTE — Progress Notes (Signed)
ivf monitoring

## 2015-08-18 NOTE — Addendum Note (Signed)
Addended by: Renold Genta L on: 08/18/2015 11:33 AM     Modules accepted: Orders

## 2015-08-18 NOTE — Telephone Encounter (Signed)
Pre-Op Instructions for IVF Egg Retrieval     Please take your hCG 10,000 units (may also be called Pregnyl, Novarel or Human Chorionic Gonadotropin) tonight at 8:00 pm for your Egg Retrieval on 8/7 .  Your hCG is already mixed, give this entire amount (64ml) IM (intramuscular) in the upper outer quadrant of your buttocks.  You will use a 33ml syringe with a 21 gauge 1-1/2 inch needle for this injection. It is very important that you take your hCG at the exact time that you have been instructed.   .      You will need to be NPO after midnight on 8/6 (which means you can not have anything to eat or drink, not even a little sip of water).  It would be best to have a late snack with fluids around 8 -10pm on 8/6.     You will need to have your blood drawn to confirm the presence of hCG in your blood on 8/6 in the morning @ [facility], with lab requisition you were given.   We will contact you only if there is a problem with this result.    Please arrive at our office ([place], [address]) at 7:00 am. Dennis Bast will need to remove your nail polish and jewelry prior to your arrival.  Remove your contacts.  Please avoid perfumes or scented lotions.  Please bring a small snack with you as well.     Your partner should have an ejaculation today and not again until he collects the morning of the egg retrieval. He should drink 4-6 glasses of water that morning.    If you have additional questions about how to administer your hCG injection, you can refer to [place]  [email or web address]     Please call our office between 8am-4pm (Monday - Thursday) and 8am-12pm (Friday and Saturday) if you have routine questions about your instructions.  If you have an urgent question and it is after hours please call our office at [phone] and the answering service will page the physician that is on call.

## 2015-08-20 ENCOUNTER — Other Ambulatory Visit: Payer: Self-pay

## 2015-08-20 ENCOUNTER — Ambulatory Visit: Payer: Self-pay | Admitting: Reproductive Endocrinology and Infertility

## 2015-08-20 VITALS — BP 109/63 | HR 67 | Temp 98.7°F | Resp 18 | Ht 68.0 in | Wt 167.3 lb

## 2015-08-20 DIAGNOSIS — N979 Female infertility, unspecified: Secondary | ICD-10-CM

## 2015-08-20 LAB — REI ESTRADIOL (PERFORMED AT STRONG FERTILITY CLINIC): Estradiol,REI: 2983 pg/mL

## 2015-08-20 MED ORDER — LACTATED RINGERS IV SOLN *I*
125.0000 mL/h | INTRAVENOUS | Status: AC
Start: 2015-08-20 — End: 2015-10-19
  Administered 2015-08-20: 125 mL/h via INTRAVENOUS

## 2015-08-20 MED ORDER — CEFOXITIN SODIUM 1 GM IV SOLR *I*
1000.0000 mg | Freq: Once | INTRAVENOUS | Status: AC
Start: 2015-08-20 — End: 2015-08-20
  Administered 2015-08-20: 1000 mg via INTRAVENOUS

## 2015-08-20 MED ORDER — DOXYCYCLINE HYCLATE 100 MG PO TABS *I*
100.0000 mg | ORAL_TABLET | Freq: Two times a day (BID) | ORAL | 1 refills | Status: DC
Start: 2015-08-20 — End: 2016-03-28

## 2015-08-20 MED ORDER — ACETAMINOPHEN 500 MG PO TABS *I*
1000.0000 mg | ORAL_TABLET | Freq: Once | ORAL | Status: AC
Start: 2015-08-20 — End: 2015-08-20
  Administered 2015-08-20: 1000 mg via ORAL

## 2015-08-20 MED ORDER — OXYCODONE HCL 5 MG PO TABS *I*
10.0000 mg | ORAL_TABLET | ORAL | Status: AC | PRN
Start: 2015-08-20 — End: 2015-08-27

## 2015-08-20 MED ORDER — DOXYCYCLINE HYCLATE 100 MG IV SOLR *I*
100.0000 mg | Freq: Once | INTRAVENOUS | Status: AC
Start: 2015-08-20 — End: 2015-08-20
  Administered 2015-08-20: 100 mg via INTRAVENOUS

## 2015-08-20 NOTE — Patient Instructions (Signed)
After Egg Retrieval    You Should:    1. Expect some blood-tinged drainage for 72 hours, mild cramps or mild nausea.  2. Rest at home the remainder of the day.  3. Use Tylenol, 1-2 tablets every 4-6 hours if needed for cramps; you can take aspirin, Nuprin or Advil or equivalent medications q 4-6 hours as needed. You can take again at 230 pm.  4. Expect to bathe or shower.  5. The lab will call you in the morning with fertilization results, a nurse will check in to see how you are feeling. Your next step will be to call with the start of your period, if this occurs on a weekend, call Monday am.       You Should NOT:    1. Be alone for 8-10 hours.  Someone should be with you to make a phone call or bring you back to the hospital if necessary.  2. Have severe nausea, heavy bleeding, severe pain or major problems with  dizziness or headache.  3. Exercise strenuously.  4. Have intercourse or use tampons until further notice.        If you have additional questions, or if problems arise, please call the nursing staff at [phone].  After business hours or on weekends you will reach our after hours answering service.  Please leave a message with the representative and your call will be returned in the morning.  If you have an emergency, have the representative page the reproductive endocrinology doctor on call.                                                                                                                                                                   2/12 [facility]

## 2015-08-20 NOTE — Procedures (Signed)
REI IVF Retrieval Procedure Note    08/20/2015    Surgeon: Shelly Coss, MD    Assistant: Lamont Dowdy    PRE-OP DIAGNOSIS: Infertility     POST-OP DIAGNOSIS: Infertility    TIME OUT PARTICIPANTS: Shelly Coss, MD, Lamont Dowdy, Dr. Eula Flax    CORRECT PATIENT: (use 2 Identifiers) Yes     OPERATIVE PROCEDURE:  Transvaginal ultrasound guided oocyte retrieval     ANESTHESIA: Deep sedation.      PREOPERATIVE HISTORY: 40 yo with endometriosis for IVF/PGS    DESCRIPTION OF PROCEDURE:  Patient was premedicated with cefoxitin/doxy and was take to the procedure room, where she was given IV administration of propofol and fentanyl to accomplish deep sedation. She was then placed in dorsal lithotomy, prepped, and draped in the usual sterile fashion.  After a perineal, vaginal, and cervical prep with sterile saline, a transvaginal ultrasound was used to visualize both ovaries, which were noted to be accessible.  With the needle guide in place, a 17 gauge single lumen needle was used to aspirate multiple follicles on the right ovary.  Attention was then turned to the left ovary and the same procedure was repeated for a total recovery of 9 oocytes. The ultrasound probe was used to inspect the pelvis; there was no evidence of hematomas or fluid collection.  At this point, the speculum was replaced and the cervix and vagina were noted to be hemostatic.  The anesthesia was then reversed.  The patient was then returned to the recovery room in stable condition.     Shelly Coss, MD

## 2015-08-20 NOTE — Preop H&P (Signed)
UPDATES TO PATIENT'S CONDITION on the DAY OF SURGERY/PROCEDURE    I. Updates to Patient's Condition (to be completed by a provider privileged to complete a H&P, following reassessment of the patient by the provider):    Day of Surgery/Procedure Update:  H&P completed in past two weeks, no updates.            II. Procedure Readiness   I have reviewed the patient's H&P and updated condition. By completing and signing this form, I attest that this patient is ready for surgery/procedure.    III. Attestation   I have reviewed the updated information regarding the patient's condition and it is appropriate to proceed with the planned surgery/procedure.    Shelly Coss, MD as of 8:24 AM 08/20/2015

## 2015-08-20 NOTE — Progress Notes (Signed)
Egg Retrieval Discharge    Patient did well with procedure. Patient was able to tolerate PO, ambulate to bathroom and void. Pt is a freeze all with PGS, pt is also on the extended Doxycyline protocol. Patient discharged to home with spouse in stable condition.

## 2015-08-21 ENCOUNTER — Telehealth: Payer: Self-pay

## 2015-08-21 NOTE — Telephone Encounter (Signed)
Post Op call  Patient is feeling pretty good, says she has dull lower back pain which she is managing with Ibuprofen and hot packs. She also states she has gained 3-4 lb over the past few weeks. Instructed patient to keep an eye on her weight and to let us know if she has a 5 lb weight gain in 24 hours  . Patient denies any fever, severe pain or bleeding. Pt is continuing her extended doxycycline x 10 days, she is also doing a freeze all.

## 2015-08-21 NOTE — Telephone Encounter (Signed)
Post Op call  Patient is feeling  . Patient denies any fever, severe pain or bleeding. Pt is doing a freeze all with CCS. The lab will call her on day 5/6 to let her know how many embryos were biopsied. She is planning her FET for October, so most likely she will be skipping this next cycle.

## 2015-08-31 ENCOUNTER — Encounter: Payer: Self-pay | Admitting: Reproductive Endocrinology and Infertility

## 2015-09-04 ENCOUNTER — Ambulatory Visit: Payer: Self-pay | Admitting: Reproductive Endocrinology and Infertility

## 2015-09-04 DIAGNOSIS — N979 Female infertility, unspecified: Secondary | ICD-10-CM

## 2015-09-04 NOTE — Progress Notes (Signed)
Spoke to Langley and explained to her about one 46,XX being normal out of 5 biopsied.  The rest are abnormal.  She now has one 46,XY and 2 46,XX frozen.  She requested if she can transfer the XY and I said if all embryo qualities are equal, yes.    She will call [facility] Dyke to discuss the transfer date.  She is prepared to discard the abnormal embryos and will expect to receive the forms by mail.

## 2015-09-06 NOTE — Progress Notes (Signed)
T/C to pt to discuss dates for upcoming cycle. Pt's Lmp was on 8/17 and started OCP's on 8/20. Pt would like to wait until November for transfer due to family coming in town for 2 weeks on 9/22. Pt will plan to cycle on OCP's, active pills thru 9/9 break on 9/10-9/16, restart active pills on 9/17-10/17. Writer will work on plan and send via my chart and RN will follow up when the time is closer to review and order med's with specialty pharmacy.

## 2015-09-12 ENCOUNTER — Encounter: Payer: Self-pay | Admitting: Reproductive Endocrinology and Infertility

## 2015-09-26 ENCOUNTER — Other Ambulatory Visit: Payer: Self-pay

## 2015-09-26 ENCOUNTER — Encounter: Payer: Self-pay | Admitting: Reproductive Endocrinology and Infertility

## 2015-09-27 MED ORDER — NORGESTIM-ETH ESTRAD TRIPHASIC 0.18/0.215/0.25 MG-35 MCG PO TABS *I*
ORAL_TABLET | ORAL | 2 refills | Status: DC
Start: 2015-09-27 — End: 2016-03-28

## 2015-10-05 ENCOUNTER — Other Ambulatory Visit: Payer: Self-pay

## 2015-10-05 ENCOUNTER — Telehealth: Payer: Self-pay

## 2015-10-05 NOTE — Telephone Encounter (Signed)
Phone call to pt to review her FET plan, confirm all dates, and discuss med order.  She has 3 vials of P4 in oil at home, Doxy, and Medrol.  Told her that writer will confirm if the Medrol will be utilized and follow up with her next week in that regard.  She also asks if the lining check on 11/14/15 could be done locally or not, she very  much prefers locally.  She plans to return notarized discard consent for her abnormal embryos when she mails back the notarized thaw consent.  Will plan to follow up with her 10/08/15 if possible.  She will be in [place] 9/27-9/29/17, but can be reached by cell phone or my chart message.

## 2015-10-08 MED ORDER — SYRINGE/NEEDLE (DISP) 22G X 1-1/2" 3 ML MISC *A*
1 refills | Status: DC
Start: 2015-10-08 — End: 2015-11-14

## 2015-10-08 MED ORDER — NEEDLE (DISP) 18G X 1-1/2" MISC *A*
3 refills | Status: DC
Start: 2015-10-08 — End: 2015-11-14

## 2015-10-08 MED ORDER — ESTRADIOL 1 MG PO TABS *I*
ORAL_TABLET | ORAL | 2 refills | Status: DC
Start: 2015-10-08 — End: 2016-03-28

## 2015-10-08 MED ORDER — LEUPROLIDE ACETATE 5 MG/ML IJ KIT *I*
PACK | INTRAMUSCULAR | 2 refills | Status: DC
Start: 2015-10-08 — End: 2015-11-14

## 2015-10-08 MED ORDER — INSULIN SYRINGE-NEEDLE U-100 30G X 1/2" 0.5 ML MISC *A*
1 refills | Status: DC
Start: 2015-10-08 — End: 2015-11-14

## 2015-10-08 MED ORDER — PROGESTERONE 100 MG VA SUPP *I*
100.0000 mg | Freq: Every day | VAGINAL | 4 refills | Status: DC
Start: 2015-10-08 — End: 2016-03-28

## 2015-11-01 ENCOUNTER — Telehealth: Payer: Self-pay | Admitting: Reproductive Endocrinology and Infertility

## 2015-11-01 ENCOUNTER — Encounter: Payer: Self-pay | Admitting: Reproductive Endocrinology and Infertility

## 2015-11-01 ENCOUNTER — Encounter: Payer: Self-pay | Admitting: Gastroenterology

## 2015-11-01 NOTE — Telephone Encounter (Signed)
TC to pt, P4= 1.33.  Ok to start Estrace as planned however we will re-check labs on 10/24 to be sure that Prog is not rising.

## 2015-11-02 ENCOUNTER — Encounter: Payer: Self-pay | Admitting: Gastroenterology

## 2015-11-06 ENCOUNTER — Telehealth: Payer: Self-pay | Admitting: Reproductive Endocrinology and Infertility

## 2015-11-06 ENCOUNTER — Encounter: Payer: Self-pay | Admitting: Reproductive Endocrinology and Infertility

## 2015-11-06 ENCOUNTER — Encounter: Payer: Self-pay | Admitting: Gastroenterology

## 2015-11-06 NOTE — Telephone Encounter (Signed)
TC to pt, prog is consistent and she is ok to proceed with plan.  Pt wonders if she could/should have her lining check done locally on 11/1.  Will check with Dr. Thurston Pounds for his recommendations.

## 2015-11-07 ENCOUNTER — Encounter: Payer: Self-pay | Admitting: Reproductive Endocrinology and Infertility

## 2015-11-13 ENCOUNTER — Encounter: Payer: Self-pay | Admitting: Reproductive Endocrinology and Infertility

## 2015-11-14 ENCOUNTER — Encounter: Payer: Self-pay | Admitting: Reproductive Endocrinology and Infertility

## 2015-11-14 ENCOUNTER — Ambulatory Visit: Payer: Self-pay | Admitting: Reproductive Endocrinology and Infertility

## 2015-11-14 ENCOUNTER — Encounter: Payer: Self-pay | Admitting: Gastroenterology

## 2015-11-14 ENCOUNTER — Telehealth: Payer: Self-pay

## 2015-11-14 DIAGNOSIS — N979 Female infertility, unspecified: Secondary | ICD-10-CM

## 2015-11-14 NOTE — Telephone Encounter (Addendum)
E 2 = 717, progesterone = 1.72 , endo lining 7.46 - 7.79 mm today. T/C to pt LM to repeat progesterone on 11/3. Pt told to call to let us know where to fax lab slip for testing.      3:30 pm Return call to pt LM that after Dr Vitek discussed results with Dr Thurston Pounds he felt that cycle should be cancelled due to progesterone level. Pt told to stop all FET med's and no need to repeat progesterone on 11/3

## 2015-11-14 NOTE — Progress Notes (Signed)
Pt was seen for a lining check today. Will await MD review today and contact patient later today with plan.

## 2015-11-15 ENCOUNTER — Encounter: Payer: Self-pay | Admitting: Reproductive Endocrinology and Infertility

## 2015-11-21 ENCOUNTER — Encounter: Payer: Self-pay | Admitting: Reproductive Endocrinology and Infertility

## 2015-11-23 ENCOUNTER — Telehealth: Payer: Self-pay

## 2015-11-23 ENCOUNTER — Encounter: Payer: Self-pay | Admitting: Reproductive Endocrinology and Infertility

## 2015-11-23 NOTE — Telephone Encounter (Signed)
Phone call to pt to share recommendations from 11/21/15 IVF meeting to consider a natural FET protocol.  Explained calling with menses, baseline evaluation by day 3, midcycle monitoring to start on cycle day 10.  She would like further discussion about the pregnancy rates comparing natural FET to medicated FET.  Writer inquired if she is on any supplements aside from PNV and she is not.  She would like to speak to Dr Thurston Pounds at his earliest convenience about this. She had been thinking about taking some time off before going forward d/t holidays and winter driving conditions.  Offered to schedule a follow up appt but she requests a phone call.

## 2015-11-30 ENCOUNTER — Telehealth: Payer: Self-pay | Admitting: Reproductive Endocrinology and Infertility

## 2015-11-30 NOTE — Telephone Encounter (Signed)
I informed Lori Atkinson that there is not difference in success rates based on the cochrane review of 18 RCTs.  We discussed the pros and cons of natural cycle in her case and decided she will call with her next menses, look at how the dates fall and then decide the next step.

## 2015-12-18 ENCOUNTER — Encounter: Payer: Self-pay | Admitting: Reproductive Endocrinology and Infertility

## 2016-02-05 ENCOUNTER — Encounter: Payer: Self-pay | Admitting: Reproductive Endocrinology and Infertility

## 2016-02-06 ENCOUNTER — Other Ambulatory Visit: Payer: Self-pay

## 2016-02-06 MED ORDER — DOXYCYCLINE HYCLATE 100 MG PO CAPS *I*
ORAL_CAPSULE | ORAL | 1 refills | Status: DC
Start: 2016-02-06 — End: 2016-03-28

## 2016-02-06 MED ORDER — CHORIOGONADOTROPIN ALFA (OVIDREL) 250 MCG/0.5ML SC SOSY *I*
250.0000 ug | PREFILLED_SYRINGE | Freq: Once | SUBCUTANEOUS | 3 refills | Status: AC
Start: 2016-02-06 — End: 2016-02-06

## 2016-02-06 MED ORDER — PROGESTERONE 8 % VA GEL *I*
VAGINAL | 4 refills | Status: AC
Start: 2016-02-06 — End: ?

## 2016-02-11 ENCOUNTER — Other Ambulatory Visit: Payer: Self-pay | Admitting: Gastroenterology

## 2016-02-11 ENCOUNTER — Inpatient Hospital Stay: Admit: 2016-02-11 | Discharge: 2016-02-11 | Disposition: A | Discharge status: Home / Self Care | Payer: Self-pay

## 2016-02-12 ENCOUNTER — Telehealth: Payer: Self-pay

## 2016-02-12 ENCOUNTER — Other Ambulatory Visit: Payer: Self-pay | Admitting: Internal Medicine

## 2016-02-12 ENCOUNTER — Encounter: Payer: Self-pay | Admitting: Reproductive Endocrinology and Infertility

## 2016-02-12 NOTE — Telephone Encounter (Signed)
Phone call to pt to tell her that her labs have been reviewed.  Will likely repeat P4 level 02/19/16 when CD 10 assessment is done.  It appears that the images were received through [facility].  She will schedule at [facility] unless weather is such that travel is difficult on 02/19/16.  In that case she would schedule at [facility].

## 2016-02-13 ENCOUNTER — Encounter: Payer: Self-pay | Admitting: Reproductive Endocrinology and Infertility

## 2016-02-14 ENCOUNTER — Telehealth: Payer: Self-pay

## 2016-02-14 NOTE — Telephone Encounter (Signed)
I submitted a prior authorization for Crinone to [facility].  I received a fax stating that the patients plan limits are [id number] applicators for a 30 day supply.  Per Casimiro Needle,  the [id number] applicators would get her through until she needed to test for pregnancy.  I asked the patient to call [facility] and ask them to fill just the [id number] applicators.  I told her if there was any issues to call me back and let me know.

## 2016-02-18 ENCOUNTER — Other Ambulatory Visit: Payer: Self-pay

## 2016-02-18 DIAGNOSIS — N979 Female infertility, unspecified: Secondary | ICD-10-CM

## 2016-02-19 ENCOUNTER — Encounter: Payer: Self-pay | Admitting: Reproductive Endocrinology and Infertility

## 2016-02-19 ENCOUNTER — Ambulatory Visit: Payer: Self-pay | Admitting: Reproductive Endocrinology and Infertility

## 2016-02-19 DIAGNOSIS — N979 Female infertility, unspecified: Secondary | ICD-10-CM

## 2016-02-19 LAB — REI LH (PERFORMED AT STRONG FERTILITY CLINIC): LH,REI: 16.4 m[IU]/mL

## 2016-02-19 LAB — REI PROGESTERONE (PERFORMED AT STRONG FERTILITY CLINIC): Progesterone,REI: 0.4 ng/mL

## 2016-02-19 LAB — REI ESTRADIOL (PERFORMED AT STRONG FERTILITY CLINIC): Estradiol,REI: 218 pg/mL

## 2016-02-19 NOTE — Addendum Note (Signed)
Addended by: Ileene Rubens on: 02/19/2016 11:01 AM     Modules accepted: Orders

## 2016-02-19 NOTE — Progress Notes (Signed)
Pt was seen for a midcycle u/s evaluation today, await labs for further instructions.

## 2016-02-25 ENCOUNTER — Encounter: Payer: Self-pay | Admitting: Reproductive Endocrinology and Infertility

## 2016-02-26 ENCOUNTER — Encounter: Payer: Self-pay | Admitting: Gastroenterology

## 2016-02-26 ENCOUNTER — Ambulatory Visit: Payer: Self-pay | Admitting: Reproductive Endocrinology and Infertility

## 2016-02-26 ENCOUNTER — Encounter: Payer: Self-pay | Admitting: Reproductive Endocrinology and Infertility

## 2016-02-26 DIAGNOSIS — N979 Female infertility, unspecified: Secondary | ICD-10-CM

## 2016-02-26 NOTE — Procedures (Signed)
Embryo Transfer Record    Patient identify verified (2 identifiers): yes    Procedure verified: yes    Patient name/number of embryos to transfer confirmed by lab to provider: yes     List Participants: 1. Corinna Gab, MD        2. Benay Pike, PHD        3. Lamont Dowdy RN    IVF Type: Cyropreserved Embryo     Medication: Doxycycline prophylaxis     Tenaculum: no    Ultrasound: Transabdominal     Catheter Used: Juleen China and [employer]    Depth of Transfer: 6cm    Number of passes: 1    Embryos transferred: Total #: 1      3-cell:   4-cell:   5-cell:   6-cell:   7-cell:   8-cell:   [place]:   Blast: 1 #1 XY  Other:    Procedure Results:    Bleeding : no   Cramping:no   Spotting:no    Special Notes: Patient is doing natural cycle and did not start Crinone. Will have her start now. Timing based on ovidrel     Corinna Gab, MD

## 2016-02-26 NOTE — Progress Notes (Signed)
Pt here for a frozen Embryo transfer. Pt tolerated procedure well and discharge instructions reviewed. Lab requisitions given for BHCG on 03/07/16 at [facility].

## 2016-02-26 NOTE — Patient Instructions (Signed)
AFTER FROZEN EMBRYO TRANSFER    YOU SHOULD:    1. Expect some blood tinged drainage for several days.    2. Expect mild cramping for several hours; usually medicine is not necessary.  Tylenol, 1 to 2 tablets every 4 to 6 hours can be taken if needed; ibuprofen and aspirin are not recommended.    3. In general, it is recommended that you do not return to work the day of transfer and go home and rest,    Continue Progesterone as your protocol states. If your pregnancy test is positive the progesterone continues up to the 11 th week of pregnancy or as directed.      5. A Quantitative Beta hCG level will be done on 03/07/16 (Friday).  Please call with contact phone number on days of lab test. Lab requisitions attached.    6. All medications will continue thru pregnancy test.  If test is positive all medications will continue thru  11 weeks or as directed.     YOU SHOULD NOT:    1. Have heavy bleeding, severe pain or fever (temperature greater than 99.6 F).    2. Have intercourse for two weeks.    3. Participate in strenuous activity or exercise for two weeks.    4. Increase your body core temperature, i.e. saunas, prolonged hot baths.    If you have additional questions or problems arise, please call [phone].  On weekends, or after hours, please call [phone] and you will reach our after hours answering service.  Please leave your name and phone number and a nurse will get back to you either later that day or in the morning.  In a medical emergency, please notify the answering service and they will page the doctor on call.                                                                                                                             2/12 [facility]

## 2016-03-07 ENCOUNTER — Encounter: Payer: Self-pay | Admitting: Reproductive Endocrinology and Infertility

## 2016-03-07 ENCOUNTER — Telehealth: Payer: Self-pay

## 2016-03-07 NOTE — Telephone Encounter (Signed)
Phone call to tell Kathlee Nations that she is pregnant with a bhcg level today at [employer] = 386.  Advised repeat bhcg on 03/10/16 with TSH.

## 2016-03-10 ENCOUNTER — Telehealth: Payer: Self-pay | Admitting: Reproductive Endocrinology and Infertility

## 2016-03-10 ENCOUNTER — Encounter: Payer: Self-pay | Admitting: Reproductive Endocrinology and Infertility

## 2016-03-10 NOTE — Telephone Encounter (Signed)
TC to pt, QhCG = 1340.  Plan to repeat on 3/2. Will need to follow up on TSH tomorrow.

## 2016-03-14 ENCOUNTER — Encounter: Payer: Self-pay | Admitting: Reproductive Endocrinology and Infertility

## 2016-03-14 ENCOUNTER — Telehealth: Payer: Self-pay

## 2016-03-14 DIAGNOSIS — O0900 Supervision of pregnancy with history of infertility, unspecified trimester: Secondary | ICD-10-CM

## 2016-03-14 NOTE — Telephone Encounter (Signed)
Pt given results and will have VUL around 7 wks here at [facility] ([facility]) QHCG=9533 GA 5 1/7 congrats offered

## 2016-03-27 ENCOUNTER — Encounter: Payer: Self-pay | Admitting: Reproductive Endocrinology and Infertility

## 2016-03-28 ENCOUNTER — Ambulatory Visit
Payer: BLUE CROSS/BLUE SHIELD | Attending: Reproductive Endocrinology and Infertility | Admitting: Reproductive Endocrinology and Infertility

## 2016-03-28 ENCOUNTER — Encounter: Payer: Self-pay | Admitting: Reproductive Endocrinology and Infertility

## 2016-03-28 DIAGNOSIS — O0900 Supervision of pregnancy with history of infertility, unspecified trimester: Secondary | ICD-10-CM

## 2016-03-28 DIAGNOSIS — O0901 Supervision of pregnancy with history of infertility, first trimester: Secondary | ICD-10-CM

## 2016-03-28 NOTE — Progress Notes (Signed)
[facility] OB Ultrasound Visit Note    Lori Atkinson is a 41 y.o.  G1 P1 who initially presented in 2015 with a history pelvic pain and endometriosis.  She underwent robotic assisted laparoscopic surgery and IVF with [facility] in 2017.    She has conceived with a recent frozen embryo treatment cycle. A single embryo was transferred on 02/26/16, giving her an adjusted LMP of 02/07/16.  She was noted to have an initial hCG level of 386 and a normal rate of rise.  She was seen today for a follow-up visit in conjunction with an ultrasound exam to assess viability of this pregnancy. She has no history of vaginal bleeding, but does note frequent mild pelvic cramping.   Current medications include  Progesterone and vitamins.  Blood type is O Negative.    Transvaginal ultrasound was performed today. This ultrasound confirmed a singleton intrauterine pregnancy. The gestational sac measured 38.8 mm; contained within this sac is a normal appearing yolk sac as well as an embryonic pole with a crown-rump length of 10.2 mm.  Fetal heart motion is noted at an appropriate rate.  There is no evidence of retroplacental clots or collections and the cervix appears to be long and closed.  The right ovary was normal and left ovary contained a single cyst with a few internal echoes (4.33 cm x 3.11 cm).  The cervix was long and closed. Gestational age based on ultrasound was consistent with clinical dating (7 weeks 1 day).    The results of the ultrasound findings were discussed with the patient and her spouse. Questions answered about the likely nature of the left ovarian cyst.  General pregnancy advice was shared.  She will continue the progesterone gel through [redacted] weeks gestation and make an appointment for pregnant care.     Our encounter lasted 15 minutes, >50% of which was spent counseling and coordinating her care.

## 2016-03-28 NOTE — Patient Instructions (Signed)
We, the staff at [facility], would like to take this time to congratulate you on one of the most exciting experiences of your lifetime.    Please make an OB appointment and continue the [place] until April 12.   Pregnancy following infertility brings its own special joy and concerns. It is perfectly normal to be anxious after undergoing fertility treatments. While you may have an ultrasound early in your pregnancy, an appointment should be made for prenatal care with your obstetrician.      NUTRITION:    What you eat during pregnancy is very important to you and your baby.  We know that there is a direct relationship between good nutrition and a babys health.  The need for minerals and vitamins increases during pregnancy.  While eating a well-balanced diet can provide most of the needed nutrients for both you and your baby, it does not provide enough iron or folic acid.  Therefore we recommend a supplement of folic acid or prenatal vitamins during your pregnancy and even when starting fertility treatments.  It is important to know that a well-balanced diet is necessary for the vitamins to be absorbed.  Remember that your baby is depending solely on you for nourishment.  Consult your obstetrician with any questions on nutrition and before taking medications.    Additional considerations pertain to:    CAFFEINE:   The following products contain caffeine: coffee, tea, cola, diet drinks and chocolates.  It is recommended that these products not be consumed in large quantities.  With breastfeeding, be aware that caffeine is excreted in the breast milk and may lead to hyperactivity or wakefulness in infants.    SMOKING: Mothers who smoke have a greater number of unsuccessful pregnancies.  Specifically, they experience more miscarriages, stillbirths, premature delivery, birth defects and a decrease in birth weight.  Babies born to mothers who smoke weigh approximately one pound less than those born to  non-smoking mothers.  Additionally, it is recommended that newborns not be exposed to tobacco fumes, as respiratory problems are more frequent in small children exposed to smoking.      ALCOHOL: It is recommended that alcohol be avoided during pregnancy.  You may consult your physician for further advice.      DRUGS:   Before taking any medication consult your obstetrician.  Medications that may be offered during a normal pregnancy are prenatal vitamins and iron.  They are given to assist your body in meeting the daily recommendations during your pregnancy.      EXERCISE:   A daily exercise program is useful for childbirth, as well as for your total well-being.  Normal pregnant women are able to maintain activities to which they are accustomed, including swimming, running, aerobics and dancing.  If you have not preciously exercised regularly, daily walks are encouraged.  If you wish to begin an exercise program, consult your obstetrician.  Listen to your body and get plenty of rest.  Some common discomforts are: backaches, dizziness, fatigue, leg cramps, nausea and vomiting.  Your doctor will give you more information on these discomforts.      WORK/SCHOOL:  Many pregnant women work or attend school until the last few weeks before delivery. You may find the daily routine of work or school will make you feel better and keep you happy, but avoid getting overtired.  You should plan to continue work until your doctor advises you to stop.    SEAT BELTS:    Three- point seat  belts should be worn at all times while either driving or riding in a car.  Wear both a waist and shoulder strap.    X-RAYS/ANESTHESIA: In the rare event that x-rays or anesthesia should be required during pregnancy, consult with your OB/GYN.    TOXOPLASMOSIS:     If you have a cat or will be exposed to one, you may be at risk for becoming infected with toxoplasm, a parasite cats acquire when eating infected birds and mice.  To prevent toxoplasmosis,  we advise:   1. Avoidance of eating undercooked meat  2. Hand-washing after handling a cat  3. Having someone else change the litter box daily  4. Not permitting indoor cats to go outside where they may attack an infected mouse.  5. Not allowing stray cats in the house  6. Not feeding raw meat to cats    MORNING SICKNESS:    Nausea and vomiting are very common in early pregnancy, typically starting around 5 weeks of pregnancy and decreasing by 16-18 weeks. It is important to maintain healthy nutrition and hydration during this time. Here are a few suggestions to address morning sickness:   *Eat small frequent meals of bland, dry, higher protein than carbohydrate, low fat foods about every 2 hours   *Drink fluids between meals   * Fluids are better tolerated if cold, clear and carbonated and taken in small amounts between meals   *Avoid fatty, spicy foods and those difficult to digest   *Limit red meat consumption    *Avoid smells that bother you, stuffy hot rooms   *Acupressure wristbands (used for motion sickness) may be helpful   *Vitamin B6 10mg  to 25 mg twice a day   * Doxylamine (Unisom) 25 mg at bedtime and 12.5 mg am and afternoon  If you have persistent vomiting for more than a day, please call your OB/Gyn office.      HAIR DYE:    Not much is known about safety of hair dyes in pregnancy. Talk to your OB/GYN physician about any hair color treatments you are planning. He or she may suggest a toxin-free dye or postpone any chemical processes until after the first trimester.    TRAVEL:     The best time to travel is usually mid-pregnancy (14-28 weeks). Travel in an airplane is almost always safe during pregnancy. Talk to you OB/GYN re: your specific condition re: travel plans since there may be other variables to consider.    EMOTIONAL CONCERNS:    Many patients worry about a healthy pregnancy and risk of miscarriage. This is very normal after all the highs and lows of fertility treatments. Many are fearful of  being too attached to the pregnancy and cant believe that this has finally happened. It may be helpful to speak with a counselor or participate in a support group with couples who are experiencing similar reactions.    These are a few suggestions to assist you.  Do not hesitate to ask questions.  Enjoy your pregnancy. You have come a long way on your fertility journey and will have much to celebrate when your baby finally arrives.    CONGRATULATIONS!

## 2016-11-24 IMAGING — CT CT ABD-PELV W/ CM
2 of 4 series · 4 of 46 positions shown, 6 images · IV contrast (Iodine)
Comparison: None.

CLINICAL DATA: Abdominal pain, possible appendicitis,, right lower
quadrant pain for few days, diffuse abdominal pain last night

EXAM:
CT ABDOMEN AND PELVIS WITH CONTRAST
TECHNIQUE: Multidetector CT imaging of the abdomen and pelvis was performed
using the standard protocol following bolus administration of
intravenous contrast.
CONTRAST:  80mL OMNIPAQUE IOHEXOL 300 MG/ML  SOLN

[Series 204: cor · coronal · 0.50mm/px · 3 of 88 slices shown, 4 images]
[im 20/88  soft-tissue]
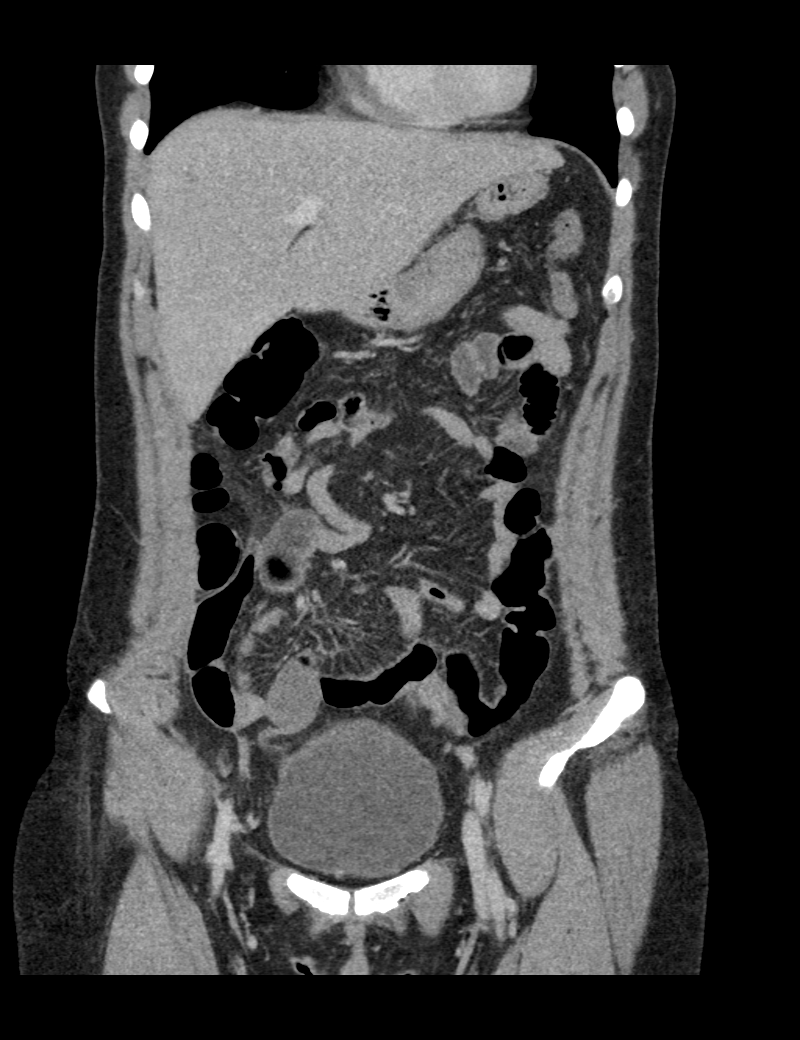
[im 20/88  bone]
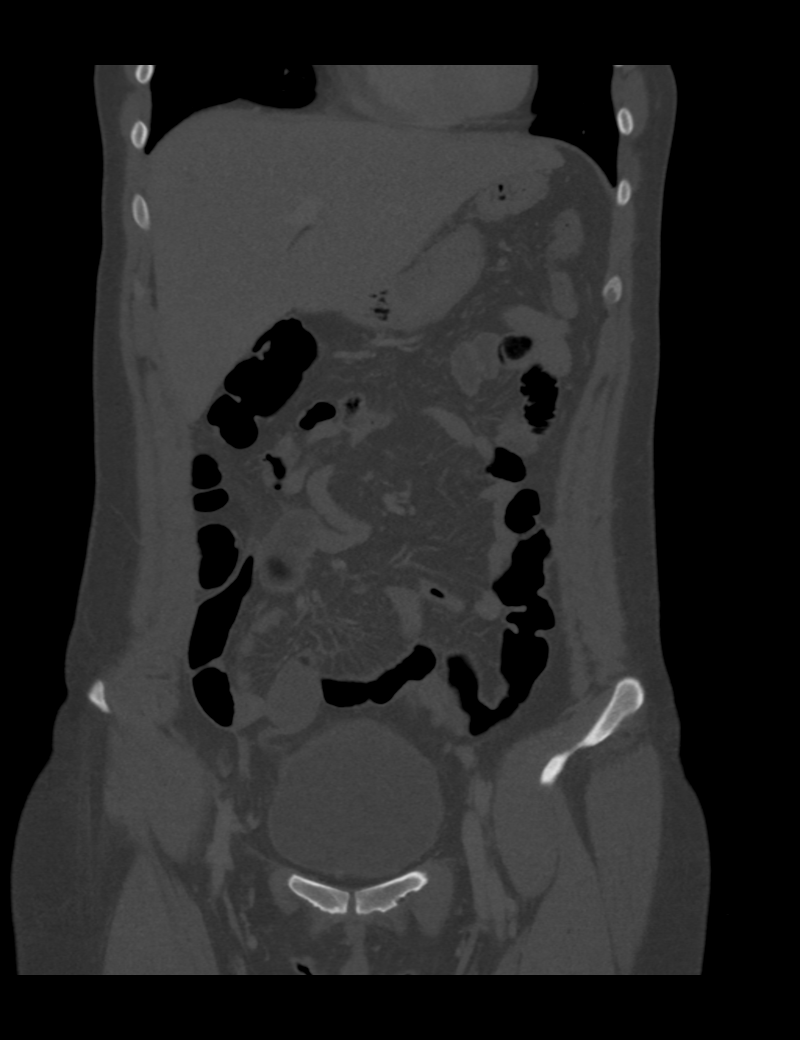
[im 49/88  soft-tissue]
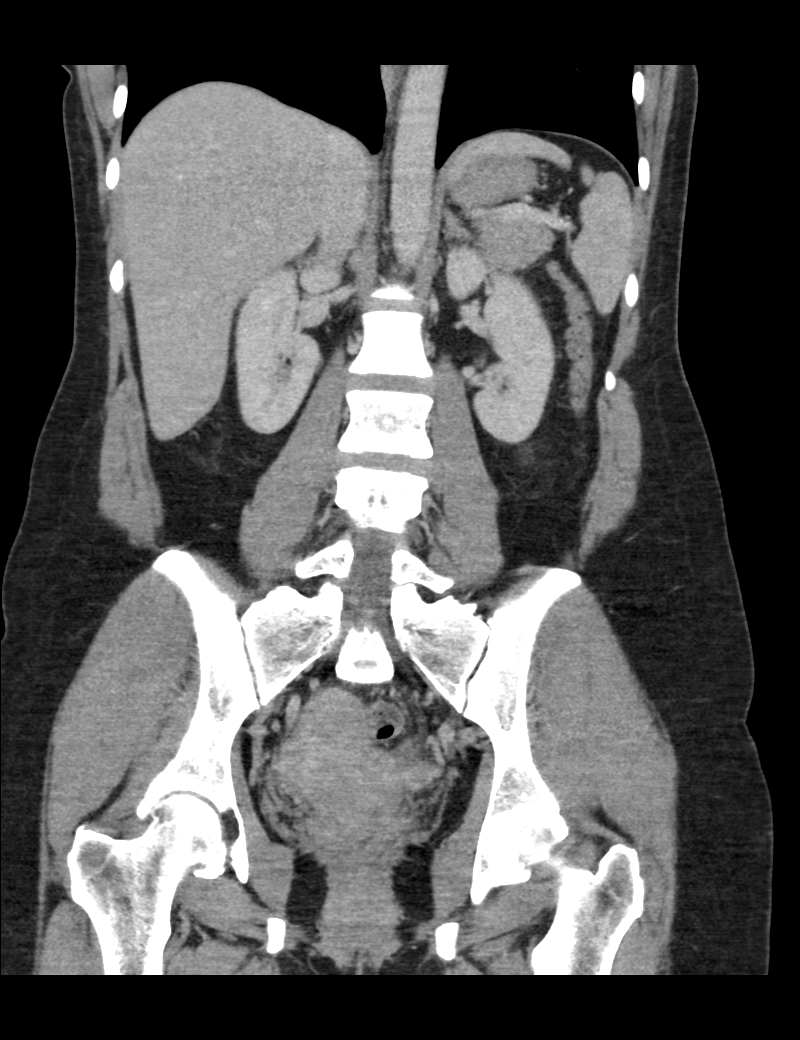
[im 68/88  soft-tissue]
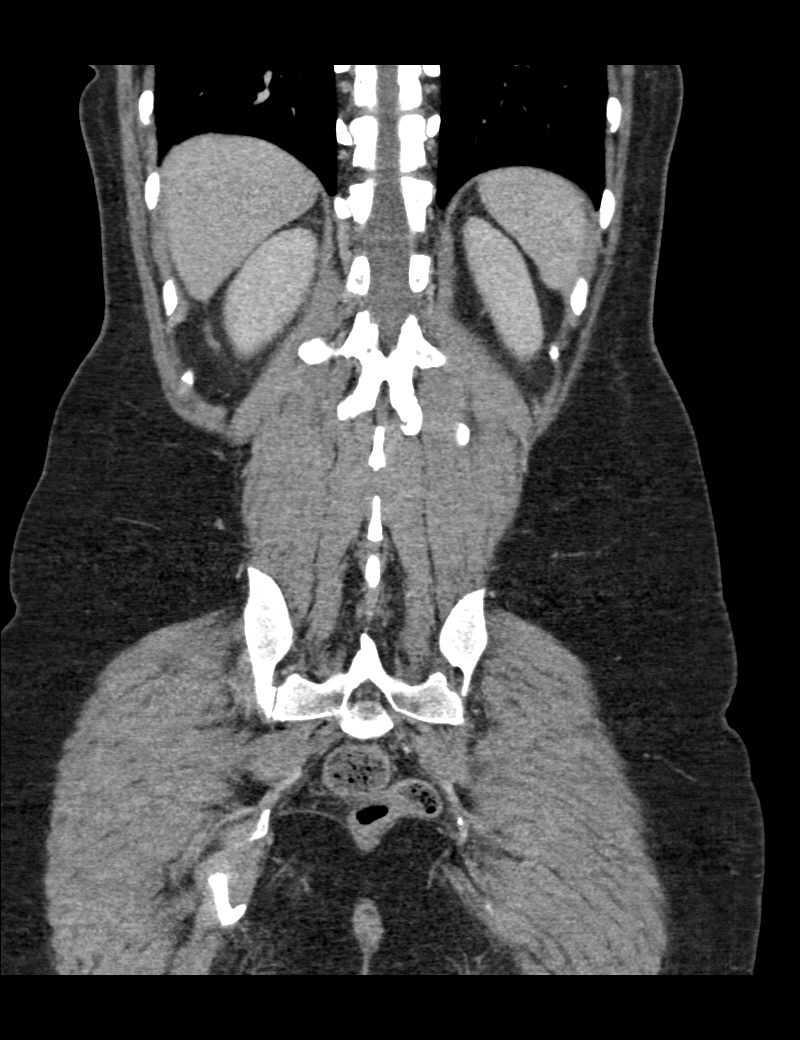

[Series 205: sag · sagittal · 0.50mm/px · 1 of 147 slices shown, 2 images]
[im 49/147  soft-tissue]
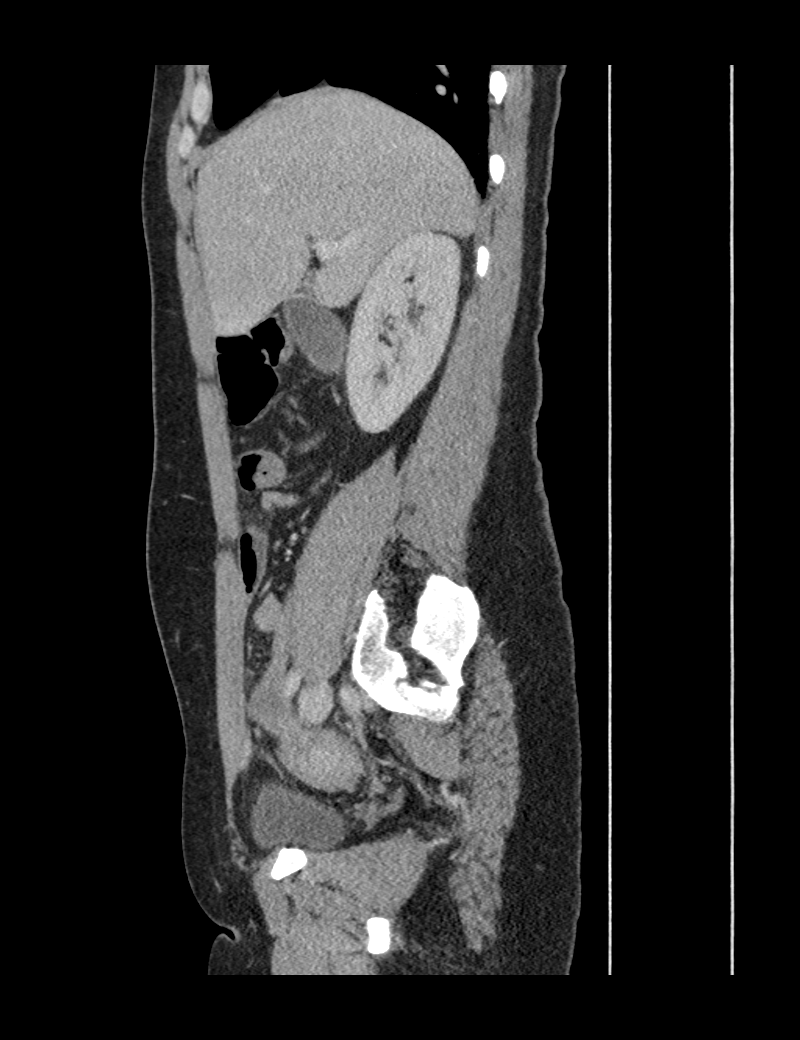
[im 49/147  bone]
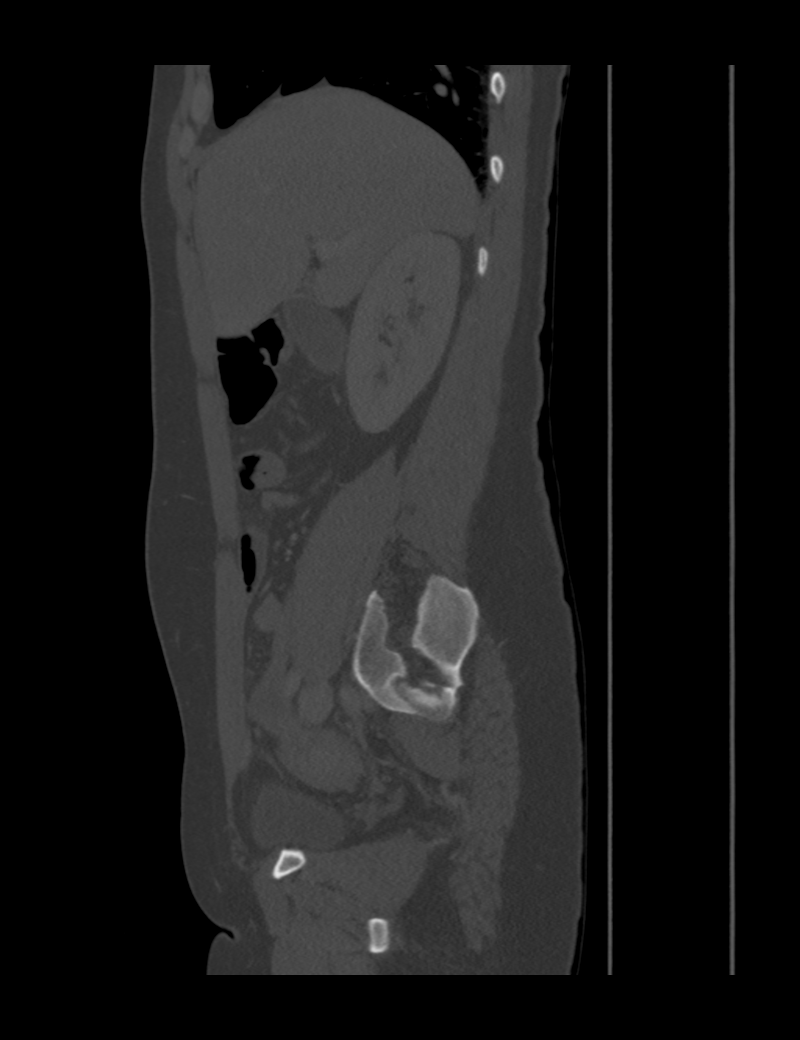

[4 of 46 positions shown; findings below may reference images not displayed]

FINDINGS: Sagittal images of the spine shows mild disc space flattening with
vacuum disc phenomenon at L5-S1 level. Minimal posterior spurring at
L5-S1 level.

Lung bases are unremarkable.

Enhanced liver shows no focal mass. No intrahepatic biliary ductal
dilatation. No calcified gallstones are noted within gallbladder.
Enhanced pancreas, spleen and adrenal glands are unremarkable.
Kidneys are symmetrical in size and enhancement. No focal renal
mass. No hydronephrosis or hydroureter. Abdominal aorta is
unremarkable.

No small bowel obstruction. No thickened or dilated small bowel
loops are noted.

No destructive bony lesions are noted within pelvis. Bilateral hip
joints are unremarkable. No inguinal adenopathy.

There is no pericecal inflammation. Normal appendix clearly
visualized in axial image 56.

Normal appendix confirmed in coronal image 29.

The uterus measures 8 by 5.3 cm. Question mild septate/ arcuate
uterus fundus. There is a myometrial fibroid in left uterine body
measures 4.8 x 3.4 cm. Best seen in sagittal image 63. Mild mass
effect on the left endometrial cavity. A simple cyst/follicle within
right ovary measures 2.6 cm. There is probable minimal hemorrhagic
cyst within left ovary anteriorly measures 3.1 cm. Second left
ovarian cyst measures 3.2 cm. Follow-up pelvic ultrasound in 6-8
weeks is suggested as clinically warranted. No pelvic free fluid.
The urinary bladder is unremarkable.

No inguinal adenopathy.
IMPRESSION: 1. No acute inflammatory process within abdomen.
2. Normal appendix.  No pericecal inflammation.
3. There is a myometrial fibroid within left uterine body measures
4.8 x 3.4 cm. A right ovarian cyst/follicle measures 2.6 cm. Minimal
hemorrhagic cyst within left ovary anteriorly measures 3.1 cm.
Second simple cyst within left ovary measures 3.2 cm. Follow-up
pelvic ultrasound in 6-8 weeks is suggested as clinically warranted.
Question arcuate uterus.
4. No hydronephrosis or hydroureter.
5. No small bowel obstruction.

## 2017-01-13 ENCOUNTER — Encounter: Payer: Self-pay | Admitting: Reproductive Endocrinology and Infertility

## 2017-01-13 DIAGNOSIS — N979 Female infertility, unspecified: Secondary | ICD-10-CM

## 2017-03-04 ENCOUNTER — Encounter: Payer: Self-pay | Admitting: Reproductive Endocrinology and Infertility

## 2018-01-13 ENCOUNTER — Encounter: Payer: Self-pay | Admitting: Reproductive Endocrinology and Infertility

## 2018-01-13 DIAGNOSIS — N979 Female infertility, unspecified: Secondary | ICD-10-CM

## 2019-01-14 ENCOUNTER — Encounter: Payer: Self-pay | Admitting: Reproductive Endocrinology and Infertility

## 2019-01-14 DIAGNOSIS — N979 Female infertility, unspecified: Secondary | ICD-10-CM

## 2020-01-14 ENCOUNTER — Encounter: Payer: Self-pay | Admitting: Reproductive Endocrinology and Infertility

## 2020-01-14 DIAGNOSIS — N979 Female infertility, unspecified: Secondary | ICD-10-CM

## 2021-01-13 ENCOUNTER — Encounter: Payer: Self-pay | Admitting: Reproductive Endocrinology and Infertility

## 2021-01-13 DIAGNOSIS — N979 Female infertility, unspecified: Secondary | ICD-10-CM

## 2022-01-13 ENCOUNTER — Encounter: Payer: Self-pay | Admitting: Reproductive Endocrinology and Infertility

## 2022-01-13 DIAGNOSIS — N979 Female infertility, unspecified: Secondary | ICD-10-CM

## 2023-01-14 ENCOUNTER — Encounter: Payer: Self-pay | Admitting: Reproductive Endocrinology and Infertility

## 2023-01-14 DIAGNOSIS — N979 Female infertility, unspecified: Secondary | ICD-10-CM
